# Patient Record
Sex: Female | Born: 1945 | Race: White | Hispanic: No | Marital: Married | Smoking: Never smoker
Health system: Southern US, Community
[De-identification: ages and names within clinical notes are randomized; demographics above are authoritative.]

## PROBLEM LIST (undated history)

## (undated) DIAGNOSIS — E669 Obesity, unspecified: Secondary | ICD-10-CM

## (undated) DIAGNOSIS — J329 Chronic sinusitis, unspecified: Secondary | ICD-10-CM

## (undated) DIAGNOSIS — I4891 Unspecified atrial fibrillation: Secondary | ICD-10-CM

## (undated) DIAGNOSIS — I10 Essential (primary) hypertension: Secondary | ICD-10-CM

## (undated) HISTORY — PX: CATARACT EXTRACTION, BILATERAL: SHX1313

---

## 2015-10-16 ENCOUNTER — Inpatient Hospital Stay (HOSPITAL_COMMUNITY)
Admission: EM | Admit: 2015-10-16 | Discharge: 2015-10-25 | DRG: 492 | Disposition: A | Discharge status: Skilled Nursing Facility | Payer: Medicare (Managed Care) | Attending: Orthopedic Surgery | Admitting: Orthopedic Surgery

## 2015-10-16 ENCOUNTER — Emergency Department (HOSPITAL_COMMUNITY): Payer: Medicare (Managed Care)

## 2015-10-16 DIAGNOSIS — S82851A Displaced trimalleolar fracture of right lower leg, initial encounter for closed fracture: Secondary | ICD-10-CM | POA: Diagnosis present

## 2015-10-16 DIAGNOSIS — T148XXA Other injury of unspecified body region, initial encounter: Secondary | ICD-10-CM

## 2015-10-16 DIAGNOSIS — Z7901 Long term (current) use of anticoagulants: Secondary | ICD-10-CM

## 2015-10-16 DIAGNOSIS — M25571 Pain in right ankle and joints of right foot: Secondary | ICD-10-CM | POA: Diagnosis not present

## 2015-10-16 DIAGNOSIS — J9811 Atelectasis: Secondary | ICD-10-CM | POA: Diagnosis not present

## 2015-10-16 DIAGNOSIS — Z79899 Other long term (current) drug therapy: Secondary | ICD-10-CM

## 2015-10-16 DIAGNOSIS — R0902 Hypoxemia: Secondary | ICD-10-CM

## 2015-10-16 DIAGNOSIS — J9601 Acute respiratory failure with hypoxia: Secondary | ICD-10-CM

## 2015-10-16 DIAGNOSIS — S82891A Other fracture of right lower leg, initial encounter for closed fracture: Secondary | ICD-10-CM

## 2015-10-16 DIAGNOSIS — Z888 Allergy status to other drugs, medicaments and biological substances status: Secondary | ICD-10-CM

## 2015-10-16 DIAGNOSIS — Z6841 Body Mass Index (BMI) 40.0 and over, adult: Secondary | ICD-10-CM

## 2015-10-16 DIAGNOSIS — I10 Essential (primary) hypertension: Secondary | ICD-10-CM

## 2015-10-16 DIAGNOSIS — E119 Type 2 diabetes mellitus without complications: Secondary | ICD-10-CM

## 2015-10-16 DIAGNOSIS — S9304XA Dislocation of right ankle joint, initial encounter: Secondary | ICD-10-CM | POA: Diagnosis present

## 2015-10-16 DIAGNOSIS — Y92000 Kitchen of unspecified non-institutional (private) residence as  the place of occurrence of the external cause: Secondary | ICD-10-CM

## 2015-10-16 DIAGNOSIS — W19XXXA Unspecified fall, initial encounter: Secondary | ICD-10-CM | POA: Diagnosis present

## 2015-10-16 DIAGNOSIS — I4891 Unspecified atrial fibrillation: Secondary | ICD-10-CM

## 2015-10-16 HISTORY — DX: Unspecified atrial fibrillation: I48.91

## 2015-10-16 HISTORY — DX: Chronic sinusitis, unspecified: J32.9

## 2015-10-16 HISTORY — DX: Essential (primary) hypertension: I10

## 2015-10-16 NOTE — ED Notes (Signed)
Bed: [id number] Expected date:  Expected time:  Means of arrival:  Comments: EMS ankle deformity

## 2015-10-16 NOTE — ED Notes (Signed)
EMS reports patient fell off a step going from kitchen to living room. Patient rolled rt ankle, EMS reports obvious deformity and edema. Rt ankle splinted by EMS. Good pedal pulses reported. First BP 150 palpated, HR 76 irregular (hx of A.fib reported). Patient given total of 100 mcg fentanyl and 4mg  iv zofran. Patient bumped rt side of head, no LOC. Denies neck or back pain. EMS reports patient recently started eliquis. Patient is from out of town to care for family members.

## 2015-10-17 ENCOUNTER — Inpatient Hospital Stay (HOSPITAL_COMMUNITY): Payer: Medicare (Managed Care) | Admitting: Critical Care Medicine

## 2015-10-17 ENCOUNTER — Encounter (HOSPITAL_COMMUNITY): Payer: Self-pay | Admitting: Emergency Medicine

## 2015-10-17 ENCOUNTER — Encounter (HOSPITAL_COMMUNITY)
Admission: EM | Disposition: A | Discharge status: Skilled Nursing Facility | Payer: Self-pay | Source: Home / Self Care | Attending: Orthopedic Surgery

## 2015-10-17 ENCOUNTER — Emergency Department (HOSPITAL_COMMUNITY): Payer: Medicare (Managed Care)

## 2015-10-17 DIAGNOSIS — S82851A Displaced trimalleolar fracture of right lower leg, initial encounter for closed fracture: Secondary | ICD-10-CM | POA: Diagnosis present

## 2015-10-17 DIAGNOSIS — Y92000 Kitchen of unspecified non-institutional (private) residence as  the place of occurrence of the external cause: Secondary | ICD-10-CM | POA: Diagnosis not present

## 2015-10-17 DIAGNOSIS — E119 Type 2 diabetes mellitus without complications: Secondary | ICD-10-CM | POA: Diagnosis present

## 2015-10-17 DIAGNOSIS — Z6841 Body Mass Index (BMI) 40.0 and over, adult: Secondary | ICD-10-CM | POA: Diagnosis not present

## 2015-10-17 DIAGNOSIS — W19XXXA Unspecified fall, initial encounter: Secondary | ICD-10-CM | POA: Diagnosis present

## 2015-10-17 DIAGNOSIS — I10 Essential (primary) hypertension: Secondary | ICD-10-CM | POA: Diagnosis present

## 2015-10-17 DIAGNOSIS — J9811 Atelectasis: Secondary | ICD-10-CM | POA: Diagnosis not present

## 2015-10-17 DIAGNOSIS — S9304XA Dislocation of right ankle joint, initial encounter: Secondary | ICD-10-CM | POA: Diagnosis present

## 2015-10-17 DIAGNOSIS — Z888 Allergy status to other drugs, medicaments and biological substances status: Secondary | ICD-10-CM | POA: Diagnosis not present

## 2015-10-17 DIAGNOSIS — I4891 Unspecified atrial fibrillation: Secondary | ICD-10-CM | POA: Diagnosis present

## 2015-10-17 DIAGNOSIS — S82851D Displaced trimalleolar fracture of right lower leg, subsequent encounter for closed fracture with routine healing: Secondary | ICD-10-CM | POA: Diagnosis not present

## 2015-10-17 DIAGNOSIS — Z7901 Long term (current) use of anticoagulants: Secondary | ICD-10-CM | POA: Diagnosis not present

## 2015-10-17 DIAGNOSIS — Z79899 Other long term (current) drug therapy: Secondary | ICD-10-CM | POA: Diagnosis not present

## 2015-10-17 DIAGNOSIS — M25571 Pain in right ankle and joints of right foot: Secondary | ICD-10-CM | POA: Diagnosis present

## 2015-10-17 DIAGNOSIS — I482 Chronic atrial fibrillation: Secondary | ICD-10-CM | POA: Diagnosis not present

## 2015-10-17 DIAGNOSIS — J9601 Acute respiratory failure with hypoxia: Secondary | ICD-10-CM | POA: Diagnosis not present

## 2015-10-17 HISTORY — PX: EXTERNAL FIXATION LEG: SHX1549

## 2015-10-17 HISTORY — PX: ANKLE CLOSED REDUCTION: SHX880

## 2015-10-17 LAB — CBC WITH DIFFERENTIAL/PLATELET
Basophils Absolute: 0 10*3/uL (ref 0.0–0.1)
Basophils Relative: 0 %
EOS PCT: 1 %
Eosinophils Absolute: 0.1 10*3/uL (ref 0.0–0.7)
HCT: 41.5 % (ref 36.0–46.0)
HEMOGLOBIN: 14.2 g/dL (ref 12.0–15.0)
LYMPHS ABS: 1.4 10*3/uL (ref 0.7–4.0)
LYMPHS PCT: 11 %
MCH: 28.7 pg (ref 26.0–34.0)
MCHC: 34.2 g/dL (ref 30.0–36.0)
MCV: 83.8 fL (ref 78.0–100.0)
Monocytes Absolute: 0.7 10*3/uL (ref 0.1–1.0)
Monocytes Relative: 5 %
NEUTROS PCT: 83 %
Neutro Abs: 10.5 10*3/uL — ABNORMAL HIGH (ref 1.7–7.7)
Platelets: 241 10*3/uL (ref 150–400)
RBC: 4.95 MIL/uL (ref 3.87–5.11)
RDW: 14.2 % (ref 11.5–15.5)
WBC: 12.7 10*3/uL — AB (ref 4.0–10.5)

## 2015-10-17 LAB — BASIC METABOLIC PANEL
Anion gap: 11 (ref 5–15)
BUN: 31 mg/dL — AB (ref 6–20)
CHLORIDE: 103 mmol/L (ref 101–111)
CO2: 25 mmol/L (ref 22–32)
Calcium: 8.4 mg/dL — ABNORMAL LOW (ref 8.9–10.3)
Creatinine, Ser: 1.04 mg/dL — ABNORMAL HIGH (ref 0.44–1.00)
GFR calc Af Amer: >60 mL/min (ref >60)
GFR calc non Af Amer: 53 mL/min — ABNORMAL LOW (ref >60)
Glucose, Bld: 200 mg/dL — ABNORMAL HIGH (ref 65–99)
POTASSIUM: 3.7 mmol/L (ref 3.5–5.1)
SODIUM: 139 mmol/L (ref 135–145)

## 2015-10-17 LAB — SURGICAL PCR SCREEN
MRSA, PCR: NEGATIVE
Staphylococcus aureus: NEGATIVE

## 2015-10-17 LAB — GLUCOSE, CAPILLARY
GLUCOSE-CAPILLARY: 171 mg/dL — AB (ref 65–99)
GLUCOSE-CAPILLARY: 181 mg/dL — AB (ref 65–99)

## 2015-10-17 SURGERY — CLOSED REDUCTION, ANKLE
Anesthesia: General | Laterality: Right

## 2015-10-17 MED ORDER — 0.9 % SODIUM CHLORIDE (POUR BTL) OPTIME
TOPICAL | Status: DC | PRN
  Administered 2015-10-17: 1000 mL

## 2015-10-17 MED ORDER — PROPOFOL 10 MG/ML IV BOLUS
INTRAVENOUS | Status: DC | PRN
  Administered 2015-10-17: 50 mg via INTRAVENOUS
  Administered 2015-10-17: 150 mg via INTRAVENOUS

## 2015-10-17 MED ORDER — HYDROMORPHONE HCL 1 MG/ML IJ SOLN
1.0000 mg | INTRAMUSCULAR | Status: DC | PRN
  Administered 2015-10-17 – 2015-10-20 (×13): 1 mg via INTRAVENOUS
  Filled 2015-10-17 (×13): qty 1

## 2015-10-17 MED ORDER — CITALOPRAM HYDROBROMIDE 20 MG PO TABS
20.0000 mg | ORAL_TABLET | Freq: Every day | ORAL | Status: DC
  Administered 2015-10-17 – 2015-10-25 (×9): 20 mg via ORAL
  Filled 2015-10-17 (×9): qty 1

## 2015-10-17 MED ORDER — MORPHINE SULFATE (PF) 2 MG/ML IV SOLN: 2.0000 mg | INTRAVENOUS | Status: DC | PRN

## 2015-10-17 MED ORDER — DOCUSATE SODIUM 100 MG PO CAPS
100.0000 mg | ORAL_CAPSULE | Freq: Two times a day (BID) | ORAL | Status: DC
  Administered 2015-10-17 – 2015-10-22 (×8): 100 mg via ORAL
  Filled 2015-10-17 (×9): qty 1

## 2015-10-17 MED ORDER — LIDOCAINE HCL (CARDIAC) 20 MG/ML IV SOLN
INTRAVENOUS | Status: DC | PRN
  Administered 2015-10-17: 70 mg via INTRAVENOUS

## 2015-10-17 MED ORDER — MIDAZOLAM HCL 2 MG/2ML IJ SOLN
INTRAMUSCULAR | Status: AC
  Filled 2015-10-17: qty 2

## 2015-10-17 MED ORDER — PHENYLEPHRINE 40 MCG/ML (10ML) SYRINGE FOR IV PUSH (FOR BLOOD PRESSURE SUPPORT)
PREFILLED_SYRINGE | INTRAVENOUS | Status: AC
  Filled 2015-10-17: qty 10

## 2015-10-17 MED ORDER — ACETAMINOPHEN 325 MG PO TABS
ORAL_TABLET | ORAL | Status: AC
  Filled 2015-10-17: qty 2

## 2015-10-17 MED ORDER — OXYCODONE HCL 5 MG PO TABS
ORAL_TABLET | ORAL | Status: AC
  Filled 2015-10-17: qty 2

## 2015-10-17 MED ORDER — ONDANSETRON HCL 4 MG PO TABS: 4.0000 mg | ORAL_TABLET | Freq: Four times a day (QID) | ORAL | Status: DC | PRN

## 2015-10-17 MED ORDER — DEXTROSE 5 % IV SOLN
3.0000 g | INTRAVENOUS | Status: DC
  Filled 2015-10-17: qty 3000

## 2015-10-17 MED ORDER — HYDROMORPHONE HCL 1 MG/ML IJ SOLN: 0.5000 mg | Freq: Once | INTRAMUSCULAR | Status: DC

## 2015-10-17 MED ORDER — SUCCINYLCHOLINE CHLORIDE 20 MG/ML IJ SOLN
INTRAMUSCULAR | Status: DC | PRN
  Administered 2015-10-17: 120 mg via INTRAVENOUS

## 2015-10-17 MED ORDER — ONDANSETRON HCL 4 MG/2ML IJ SOLN
INTRAMUSCULAR | Status: AC
  Filled 2015-10-17: qty 2

## 2015-10-17 MED ORDER — ONDANSETRON HCL 4 MG/2ML IJ SOLN
4.0000 mg | Freq: Once | INTRAMUSCULAR | Status: AC
  Administered 2015-10-17: 4 mg via INTRAVENOUS
  Filled 2015-10-17: qty 2

## 2015-10-17 MED ORDER — MIDAZOLAM HCL 5 MG/5ML IJ SOLN
INTRAMUSCULAR | Status: DC | PRN
  Administered 2015-10-17: 1 mg via INTRAVENOUS

## 2015-10-17 MED ORDER — FENTANYL CITRATE (PF) 250 MCG/5ML IJ SOLN
INTRAMUSCULAR | Status: AC
  Filled 2015-10-17: qty 5

## 2015-10-17 MED ORDER — FENTANYL CITRATE (PF) 100 MCG/2ML IJ SOLN
INTRAMUSCULAR | Status: AC
  Administered 2015-10-17: 100 ug
  Filled 2015-10-17: qty 2

## 2015-10-17 MED ORDER — ACETAMINOPHEN 500 MG PO TABS: 1000.0000 mg | ORAL_TABLET | Freq: Four times a day (QID) | ORAL | Status: DC | PRN

## 2015-10-17 MED ORDER — INSULIN ASPART 100 UNIT/ML ~~LOC~~ SOLN
0.0000 [IU] | Freq: Three times a day (TID) | SUBCUTANEOUS | Status: DC
  Administered 2015-10-18 (×2): 3 [IU] via SUBCUTANEOUS

## 2015-10-17 MED ORDER — ONDANSETRON HCL 4 MG/2ML IJ SOLN
INTRAMUSCULAR | Status: DC | PRN
  Administered 2015-10-17: 4 mg via INTRAVENOUS

## 2015-10-17 MED ORDER — METOCLOPRAMIDE HCL 5 MG PO TABS: 5.0000 mg | ORAL_TABLET | Freq: Three times a day (TID) | ORAL | Status: DC | PRN

## 2015-10-17 MED ORDER — METOCLOPRAMIDE HCL 5 MG/ML IJ SOLN: 5.0000 mg | Freq: Three times a day (TID) | INTRAMUSCULAR | Status: DC | PRN

## 2015-10-17 MED ORDER — FENTANYL CITRATE (PF) 100 MCG/2ML IJ SOLN
50.0000 ug | Freq: Once | INTRAMUSCULAR | Status: AC
  Administered 2015-10-17: 50 ug via INTRAVENOUS
  Filled 2015-10-17: qty 2

## 2015-10-17 MED ORDER — LIDOCAINE HCL (CARDIAC) 20 MG/ML IV SOLN
INTRAVENOUS | Status: AC
  Filled 2015-10-17: qty 5

## 2015-10-17 MED ORDER — ATENOLOL 50 MG PO TABS
100.0000 mg | ORAL_TABLET | Freq: Every day | ORAL | Status: DC
  Administered 2015-10-17 – 2015-10-25 (×9): 100 mg via ORAL
  Filled 2015-10-17 (×9): qty 2

## 2015-10-17 MED ORDER — LACTATED RINGERS IV SOLN
INTRAVENOUS | Status: DC
  Administered 2015-10-17: 14:00:00 via INTRAVENOUS

## 2015-10-17 MED ORDER — ACETAMINOPHEN 325 MG PO TABS
650.0000 mg | ORAL_TABLET | Freq: Four times a day (QID) | ORAL | Status: DC | PRN
  Administered 2015-10-17: 650 mg via ORAL

## 2015-10-17 MED ORDER — LABETALOL HCL 5 MG/ML IV SOLN
5.0000 mg | INTRAVENOUS | Status: DC | PRN
  Administered 2015-10-17 (×2): 5 mg via INTRAVENOUS

## 2015-10-17 MED ORDER — DM-GUAIFENESIN ER 30-600 MG PO TB12
1.0000 | ORAL_TABLET | Freq: Two times a day (BID) | ORAL | Status: DC
  Administered 2015-10-17 – 2015-10-25 (×16): 1 via ORAL
  Filled 2015-10-17 (×16): qty 1

## 2015-10-17 MED ORDER — DEXAMETHASONE SODIUM PHOSPHATE 4 MG/ML IJ SOLN
INTRAMUSCULAR | Status: DC | PRN
  Administered 2015-10-17: 4 mg via INTRAVENOUS

## 2015-10-17 MED ORDER — PANTOPRAZOLE SODIUM 40 MG PO TBEC
40.0000 mg | DELAYED_RELEASE_TABLET | Freq: Every day | ORAL | Status: DC
  Administered 2015-10-17 – 2015-10-25 (×9): 40 mg via ORAL
  Filled 2015-10-17 (×9): qty 1

## 2015-10-17 MED ORDER — ALBUTEROL SULFATE HFA 108 (90 BASE) MCG/ACT IN AERS
INHALATION_SPRAY | RESPIRATORY_TRACT | Status: DC | PRN
  Administered 2015-10-17: 2 via RESPIRATORY_TRACT

## 2015-10-17 MED ORDER — OXYCODONE HCL 5 MG PO TABS
5.0000 mg | ORAL_TABLET | ORAL | Status: DC | PRN
  Administered 2015-10-17 (×2): 10 mg via ORAL
  Administered 2015-10-17: 5 mg via ORAL
  Administered 2015-10-18 – 2015-10-22 (×13): 10 mg via ORAL
  Filled 2015-10-17 (×2): qty 2
  Filled 2015-10-17: qty 1
  Filled 2015-10-17 (×13): qty 2

## 2015-10-17 MED ORDER — ACETAMINOPHEN 650 MG RE SUPP: 650.0000 mg | Freq: Four times a day (QID) | RECTAL | Status: DC | PRN

## 2015-10-17 MED ORDER — SUCCINYLCHOLINE CHLORIDE 20 MG/ML IJ SOLN
INTRAMUSCULAR | Status: AC
  Filled 2015-10-17: qty 1

## 2015-10-17 MED ORDER — ESMOLOL HCL 100 MG/10ML IV SOLN
INTRAVENOUS | Status: DC | PRN
  Administered 2015-10-17: 40 mg via INTRAVENOUS
  Administered 2015-10-17: 30 mg via INTRAVENOUS

## 2015-10-17 MED ORDER — HYDROMORPHONE HCL 1 MG/ML IJ SOLN
INTRAMUSCULAR | Status: AC
  Filled 2015-10-17: qty 1

## 2015-10-17 MED ORDER — KETOROLAC TROMETHAMINE 30 MG/ML IJ SOLN
30.0000 mg | Freq: Once | INTRAMUSCULAR | Status: AC
  Administered 2015-10-17: 30 mg via INTRAVENOUS
  Filled 2015-10-17: qty 1

## 2015-10-17 MED ORDER — LIDOCAINE-EPINEPHRINE 2 %-1:100000 IJ SOLN
30.0000 mL | Freq: Once | INTRAMUSCULAR | Status: DC
  Filled 2015-10-17: qty 40

## 2015-10-17 MED ORDER — NALOXONE HCL 0.4 MG/ML IJ SOLN
INTRAMUSCULAR | Status: DC | PRN
Start: 2015-10-17 — End: 2015-10-17
  Administered 2015-10-17: 0.1 mg via INTRAVENOUS

## 2015-10-17 MED ORDER — FENTANYL CITRATE (PF) 100 MCG/2ML IJ SOLN
INTRAMUSCULAR | Status: DC | PRN
  Administered 2015-10-17: 50 ug via INTRAVENOUS
  Administered 2015-10-17: 100 ug via INTRAVENOUS

## 2015-10-17 MED ORDER — TRIAMTERENE-HCTZ 75-50 MG PO TABS
1.0000 | ORAL_TABLET | Freq: Every day | ORAL | Status: DC
  Administered 2015-10-17 – 2015-10-25 (×9): 1 via ORAL
  Filled 2015-10-17 (×9): qty 1

## 2015-10-17 MED ORDER — METHOCARBAMOL 1000 MG/10ML IJ SOLN
500.0000 mg | Freq: Once | INTRAMUSCULAR | Status: AC
  Administered 2015-10-17: 500 mg via INTRAMUSCULAR
  Filled 2015-10-17: qty 5

## 2015-10-17 MED ORDER — SODIUM CHLORIDE 0.9 % IV SOLN
INTRAVENOUS | Status: DC
  Administered 2015-10-17 (×2): via INTRAVENOUS

## 2015-10-17 MED ORDER — AMLODIPINE BESYLATE 2.5 MG PO TABS
2.5000 mg | ORAL_TABLET | Freq: Every day | ORAL | Status: DC
  Administered 2015-10-17 – 2015-10-25 (×9): 2.5 mg via ORAL
  Filled 2015-10-17 (×9): qty 1

## 2015-10-17 MED ORDER — NALOXONE HCL 0.4 MG/ML IJ SOLN
INTRAMUSCULAR | Status: AC
  Filled 2015-10-17: qty 1

## 2015-10-17 MED ORDER — ONDANSETRON HCL 4 MG/2ML IJ SOLN: 4.0000 mg | Freq: Four times a day (QID) | INTRAMUSCULAR | Status: DC | PRN

## 2015-10-17 MED ORDER — LACTATED RINGERS IV SOLN
INTRAVENOUS | Status: DC | PRN
  Administered 2015-10-17: 15:00:00 via INTRAVENOUS

## 2015-10-17 MED ORDER — LABETALOL HCL 5 MG/ML IV SOLN
INTRAVENOUS | Status: AC
  Filled 2015-10-17: qty 4

## 2015-10-17 MED ORDER — LIDOCAINE HCL (PF) 1 % IJ SOLN
30.0000 mL | Freq: Once | INTRAMUSCULAR | Status: AC
  Administered 2015-10-17: 30 mL
  Filled 2015-10-17: qty 30

## 2015-10-17 MED ORDER — SENNA 8.6 MG PO TABS
1.0000 | ORAL_TABLET | Freq: Two times a day (BID) | ORAL | Status: DC
  Administered 2015-10-17 – 2015-10-22 (×8): 8.6 mg via ORAL
  Filled 2015-10-17 (×9): qty 1

## 2015-10-17 MED ORDER — LACTATED RINGERS IV SOLN
INTRAVENOUS | Status: DC
  Administered 2015-10-17: 05:00:00 via INTRAVENOUS

## 2015-10-17 MED ORDER — FENTANYL CITRATE (PF) 100 MCG/2ML IJ SOLN: 100.0000 ug | Freq: Once | INTRAMUSCULAR | Status: DC

## 2015-10-17 MED ORDER — ROCURONIUM BROMIDE 50 MG/5ML IV SOLN
INTRAVENOUS | Status: AC
  Filled 2015-10-17: qty 1

## 2015-10-17 SURGICAL SUPPLY — 51 items
BANDAGE ACE 4X5 VEL STRL LF (GAUZE/BANDAGES/DRESSINGS) ×3 IMPLANT
BAR GLASS FIBER EXFX 11X350 (EXFIX) ×6 IMPLANT
BLADE SURG 10 STRL SS (BLADE) ×3 IMPLANT
BNDG COHESIVE 4X5 TAN STRL (GAUZE/BANDAGES/DRESSINGS) IMPLANT
BNDG COHESIVE 6X5 TAN STRL LF (GAUZE/BANDAGES/DRESSINGS) IMPLANT
CANISTER SUCT 3000ML PPV (MISCELLANEOUS) ×3 IMPLANT
CHLORAPREP W/TINT 26ML (MISCELLANEOUS) ×3 IMPLANT
CLAMP BLUE BAR TO PIN (MISCELLANEOUS) ×6 IMPLANT
COVER SURGICAL LIGHT HANDLE (MISCELLANEOUS) ×3 IMPLANT
CUFF TOURNIQUET SINGLE 34IN LL (TOURNIQUET CUFF) IMPLANT
CUFF TOURNIQUET SINGLE 44IN (TOURNIQUET CUFF) IMPLANT
DRAPE C-ARM 42X72 X-RAY (DRAPES) IMPLANT
DRAPE U-SHAPE 47X51 STRL (DRAPES) ×3 IMPLANT
DRSG ADAPTIC 3X8 NADH LF (GAUZE/BANDAGES/DRESSINGS) IMPLANT
DRSG PAD ABDOMINAL 8X10 ST (GAUZE/BANDAGES/DRESSINGS) IMPLANT
ELECT REM PT RETURN 9FT ADLT (ELECTROSURGICAL) ×3
ELECTRODE REM PT RTRN 9FT ADLT (ELECTROSURGICAL) ×1 IMPLANT
GAUZE SPONGE 4X4 12PLY STRL (GAUZE/BANDAGES/DRESSINGS) IMPLANT
GLOVE BIO SURGEON STRL SZ8 (GLOVE) ×3 IMPLANT
GLOVE BIOGEL PI IND STRL 8 (GLOVE) ×2 IMPLANT
GLOVE BIOGEL PI INDICATOR 8 (GLOVE) ×4
GLOVE ECLIPSE 7.5 STRL STRAW (GLOVE) ×3 IMPLANT
GOWN STRL REUS W/ TWL LRG LVL3 (GOWN DISPOSABLE) ×1 IMPLANT
GOWN STRL REUS W/ TWL XL LVL3 (GOWN DISPOSABLE) ×2 IMPLANT
GOWN STRL REUS W/TWL LRG LVL3 (GOWN DISPOSABLE) ×2
GOWN STRL REUS W/TWL XL LVL3 (GOWN DISPOSABLE) ×4
KIT BASIN OR (CUSTOM PROCEDURE TRAY) ×3 IMPLANT
KIT ROOM TURNOVER OR (KITS) ×3 IMPLANT
NEEDLE 22X1 1/2 (OR ONLY) (NEEDLE) IMPLANT
NS IRRIG 1000ML POUR BTL (IV SOLUTION) ×3 IMPLANT
PACK ORTHO EXTREMITY (CUSTOM PROCEDURE TRAY) ×3 IMPLANT
PAD ARMBOARD 7.5X6 YLW CONV (MISCELLANEOUS) ×6 IMPLANT
PAD CAST 4YDX4 CTTN HI CHSV (CAST SUPPLIES) IMPLANT
PADDING CAST COTTON 4X4 STRL (CAST SUPPLIES)
PIN CLAMP 2BAR 75MM BLUE (PIN) ×3 IMPLANT
PIN HALF YELLOW 5X160X35 (PIN) ×6 IMPLANT
PIN TRANSFIXING 5.0 (PIN) ×3 IMPLANT
SOAP 2 % CHG 4 OZ (WOUND CARE) IMPLANT
SPONGE GAUZE 4X4 12PLY STER LF (GAUZE/BANDAGES/DRESSINGS) ×3 IMPLANT
STAPLER VISISTAT 35W (STAPLE) IMPLANT
SUCTION FRAZIER HANDLE 10FR (MISCELLANEOUS)
SUCTION TUBE FRAZIER 10FR DISP (MISCELLANEOUS) IMPLANT
SUT PROLENE 3 0 PS 2 (SUTURE) ×3 IMPLANT
SUT VIC AB 3-0 PS2 18 (SUTURE) ×2
SUT VIC AB 3-0 PS2 18XBRD (SUTURE) ×1 IMPLANT
SYR CONTROL 10ML LL (SYRINGE) IMPLANT
TOWEL OR 17X24 6PK STRL BLUE (TOWEL DISPOSABLE) ×3 IMPLANT
TOWEL OR 17X26 10 PK STRL BLUE (TOWEL DISPOSABLE) ×3 IMPLANT
TUBE CONNECTING 12'X1/4 (SUCTIONS)
TUBE CONNECTING 12X1/4 (SUCTIONS) IMPLANT
WATER STERILE IRR 1000ML POUR (IV SOLUTION) IMPLANT

## 2015-10-17 NOTE — ED Notes (Signed)
[facility] called for transportation 

## 2015-10-17 NOTE — Transfer of Care (Signed)
Immediate Anesthesia Transfer of Care Note  Patient: Marisa Tran  Procedure(s) Performed: Procedure(s): CLOSED REDUCTION ANKLE (Right) EXTERNAL FIXATION RIGHT ANKLE (Right)  Patient Location: PACU  Anesthesia Type:General  Level of Consciousness: awake, alert  and oriented  Airway & Oxygen Therapy: Patient Spontanous Breathing and Patient connected to face mask oxygen  Post-op Assessment: Report given to RN, Post -op Vital signs reviewed and stable and Patient moving all extremities X 4  Post vital signs: Reviewed and stable  Last Vitals:  Filed Vitals:   10/17/15 0500 10/17/15 1010  BP: 119/66 121/59  Pulse: 88 78  Temp: 36.9 C 36.7 C  Resp: 19 16    Complications: No apparent anesthesia complications

## 2015-10-17 NOTE — ED Notes (Signed)
Patient transported to X-ray 

## 2015-10-17 NOTE — Anesthesia Preprocedure Evaluation (Addendum)
Anesthesia Evaluation  Patient identified by MRN, date of birth, ID band Patient awake  General Assessment Comment:Seems confused today  Reviewed: Allergy & Precautions, NPO status , Patient's Chart, lab work & pertinent test results  Airway Mallampati: II  TM Distance: >3 FB Neck ROM: Full    Dental  (+) Teeth Intact, Dental Advisory Given   Pulmonary neg pulmonary ROS,    breath sounds clear to auscultation       Cardiovascular hypertension,  Rhythm:Irregular Rate:Normal     Neuro/Psych negative neurological ROS     GI/Hepatic negative GI ROS, Neg liver ROS,   Endo/Other  Morbid obesity  Renal/GU negative Renal ROS     Musculoskeletal  (+) Arthritis ,   Abdominal (+) + obese,   Peds  Hematology   Anesthesia Other Findings Recent Eliquuis  Reproductive/Obstetrics                           Anesthesia Physical Anesthesia Plan  ASA: III  Anesthesia Plan: General   Post-op Pain Management:    Induction: Intravenous  Airway Management Planned: Oral ETT  Additional Equipment:   Intra-op Plan:   Post-operative Plan: Extubation in OR  Informed Consent: I have reviewed the patients History and Physical, chart, labs and discussed the procedure including the risks, benefits and alternatives for the proposed anesthesia with the patient or authorized representative who has indicated his/her understanding and acceptance.   Dental advisory given  Plan Discussed with: CRNA and Surgeon  Anesthesia Plan Comments:         Anesthesia Quick Evaluation

## 2015-10-17 NOTE — Brief Op Note (Signed)
10/16/2015 - 10/17/2015  3:32 PM  PATIENT:  Beckey Fomby  70 y.o. female  PRE-OPERATIVE DIAGNOSIS: 1.  Right ankle fracture dislocation  POST-OPERATIVE DIAGNOSIS: 1.  Right ankle trimalleolar fracture      2.  Right ankle dislocation  Procedure(s): 1.  Closed treatment of right ankle dislocation under anesthesia   2.  Application of multiplane external fixator across the right ankle   3.  AP and lateral xrays of the right ankle  SURGEON:  Toni ArthursJohn Dyonna Jaspers, MD  ASSISTANT: n/a  ANESTHESIA:   General  EBL:  minimal   TOURNIQUET:  n/a  COMPLICATIONS:  None apparent  DISPOSITION:  Extubated, awake and stable to recovery.  DICTATION ID:  [id number]

## 2015-10-17 NOTE — ED Notes (Signed)
Ortho tech is here, PA aware

## 2015-10-17 NOTE — ED Notes (Signed)
Patient states pain improved with pain med but is having painful muscle spasms every 10-30 seconds. Provider notified.

## 2015-10-17 NOTE — Progress Notes (Signed)
Orthopedic Tech Progress Note Patient Details:  Marisa JohnsSheryl Tran 05/07/1946 [id number]  Ortho Devices Type of Ortho Device: Post (short leg) splint, Stirrup splint Ortho Device/Splint Location: rle Ortho Device/Splint Interventions: Ordered, Application   [name], [name] 10/17/2015, 2:09 AM

## 2015-10-17 NOTE — ED Notes (Signed)
Report to [facility], on way

## 2015-10-17 NOTE — Op Note (Signed)
NAMMalvin Johns:  Nordhoff, Bryelle               ACCOUNT NO.:  [id number]  MEDICAL RECORD NO.:  [id number]  LOCATION:  [id number]                        FACILITY:  [facility]  PHYSICIAN:  Toni ArthursJohn Makaylyn Sinyard, MD        DATE OF BIRTH:  [birth year]  DATE OF PROCEDURE: DATE OF DISCHARGE:                              OPERATIVE REPORT   PREOPERATIVE DIAGNOSIS:  Right ankle fracture dislocation.  POSTOPERATIVE DIAGNOSES: 1. Right ankle trimalleolar fracture. 2. Right ankle dislocation.  PROCEDURE: 1. Closed treatment of right ankle dislocation under anesthesia. 2. Application of multiplane external fixator across the right ankle. 3. AP and lateral radiographs of the right ankle.  SURGEON:  Toni ArthursJohn Birt Reinoso, MD.  ASSISTANT:  None.  ANESTHESIA:  General.  ESTIMATED BLOOD LOSS:  Minimal.  TOURNIQUET TIME:  Zero.  COMPLICATIONS:  None apparent.  DISPOSITION:  Extubated, awake, and stable to recovery.  INDICATION FOR PROCEDURE:  The patient is a 70 year old woman with past medical history significant for atrial fibrillation and morbid obesity. She was in town taking care of her grandchildren, when she slipped on a step at her daughter's home and fell.  She came to the emergency room and radiographs revealed a fracture dislocation of the right ankle.  An attempt was made in the emergency department to perform closed reduction using a hematoma block.  This was unsuccessful.  The patient now presents for closed reduction and likely application of an external fixator depending on the stability of the fracture.  The patient is not a candidate for open treatment of this ankle injury at this time due to her history of taking Eliquis for atrial fibrillation.  She understands the risks and benefits, the alternative treatment options, and elects surgical treatment.  She specifically understands risks of bleeding, infection, nerve damage, blood clots, need for additional surgery, continued pain, nonunion, amputation, and  death.  PROCEDURE IN DETAIL:  After preoperative consent was obtained and the correct operative site was identified, the patient was brought to the operating room and placed supine on the operating table.  General anesthesia was induced.  Preoperative antibiotics were administered. Surgical time-out was taken.  The right lower extremity was prepped and draped in standard sterile fashion.  A longitudinal traction was applied and the ankle was felt to be reduced.  However, it dislocated easily with any movement at all.  This degree of instability requires application of an external fixator to maintain reduction of the ankle joint.  Two stab incisions were made at the tibia and a large pin clamp was used to insert 2 Schanz pins in the tibial shaft.  These were inserted after pre-drilling and both noted to have excellent purchase.  The clamp was then tightened on the pins.  A stab incision was made at the lateral wall of the calcaneus and a transfixion pin was inserted in percutaneous fashion and advanced out through the medial skin taking care to protect the neurovascular structures.  Two pin-to-bar clamps were applied. Carbon fiber external fixator bars were then applied proximally and distally on either side of the ankle.  Longitudinal traction was then applied, and the ankle joint was reduced to a neutral position.  The clamps were all  tightened provisionally.  AP and lateral radiographs confirmed appropriate reduction of the tibiotalar joint and appropriate alignment of the fractures.  The pin holes were all dressed with Xeroform and Kerlix.  A posterior splint was then applied and wrapped with Ace wrap.  The transfixion pin was capped medially and laterally. The patient was then awakened from anesthesia and transported to the recovery room in stable condition.  FOLLOWUP PLAN:  The patient will be nonweightbearing on the right lower extremity.  She will be readmitted to [facility] [place].  She  will start physical therapy tomorrow to assess whether she can go home or will need a skilled nursing facility.  She will need operative treatment of this unstable ankle fracture once her Eliquis is cleared from her system and her swelling resolved sufficiently.  I think this likely will take several days before she will be a candidate for definitive fixation of her ankle fracture.  RADIOGRAPHS:  AP and lateral radiographs are obtained of the right ankle intraoperatively.  These show interval reduction of the ankle joint as well as the trimalleolar ankle fracture.  No other acute injuries are noted.  Hardware is appropriately positioned.     Toni Arthurs, MD     JH/MEDQ  D:  10/17/2015  T:  10/17/2015  Job:  [id number]

## 2015-10-17 NOTE — ED Provider Notes (Signed)
CSN: [id number]     Arrival date & time 10/16/15  2310 History   First MD Initiated Contact with Patient 10/17/15 0022     No chief complaint on file.    (Consider location/radiation/quality/duration/timing/severity/associated sxs/prior Treatment) HPI  PMH hypertension, a. Fib and chronic sinus infections.  Patient to the ER by EMS with complaints of right ankle injury. She fell taking a misstep in the kitchen. [place] here from [place] to take care of of her grand-kids. She has obvious deformity with swelling and edema. She bumped the right side of her head and had no loc. She has gross deformity of the right ankle. Pain is 8/10. Pt has PMH of a.fib and is on Eliquis. EMS gave 100 mcg Fentanyl and 4 mg IV Zofran    Past Medical History  Diagnosis Date  . Hypertension   . Atrial fibrillation (HCC)   . Chronic sinus infection    Past Surgical History  Procedure Laterality Date  . Cataract extraction, bilateral     History reviewed. No pertinent family history. Social History  Substance Use Topics  . Smoking status: Never Smoker   . Smokeless tobacco: None  . Alcohol Use: Yes     Comment: maybe once every 3 months   OB History    No data available     Review of Systems  Review of Systems All other systems negative except as documented in the HPI. All pertinent positives and negatives as reviewed in the HPI.   Allergies  Iodine  Home Medications   Prior to Admission medications   Medication Sig Start Date End Date Taking? Authorizing Provider  acetaminophen (TYLENOL) 500 MG tablet Take 1,000 mg by mouth every 6 (six) hours as needed for mild pain, moderate pain or headache.   Yes Historical Provider, MD  amLODipine (NORVASC) 2.5 MG tablet Take 2.5 mg by mouth daily.   Yes Historical Provider, MD  apixaban (ELIQUIS) 5 MG TABS tablet Take 5 mg by mouth 2 (two) times daily.   Yes Historical Provider, MD  atenolol (TENORMIN) 100 MG tablet Take 100 mg by mouth daily.   Yes  Historical Provider, MD  citalopram (CELEXA) 20 MG tablet Take 20 mg by mouth daily.   Yes Historical Provider, MD  dextromethorphan-guaiFENesin (MUCINEX DM) 30-600 MG 12hr tablet Take 1 tablet by mouth 2 (two) times daily.   Yes Historical Provider, MD  ibuprofen (ADVIL,MOTRIN) 200 MG tablet Take 400 mg by mouth every 6 (six) hours as needed for headache, mild pain or moderate pain.   Yes Historical Provider, MD  loperamide (IMODIUM A-D) 2 MG tablet Take 2 mg by mouth 4 (four) times daily as needed for diarrhea or loose stools.   Yes Historical Provider, MD  omeprazole (PRILOSEC) 20 MG capsule Take 20 mg by mouth daily.   Yes Historical Provider, MD  triamterene-hydrochlorothiazide (MAXZIDE) 75-50 MG tablet Take 1 tablet by mouth daily.   Yes Historical Provider, MD   BP 110/64 mmHg  Pulse 44  Temp(Src) 97.5 F (36.4 C) (Oral)  Resp 19  Ht 5\' 5"  (1.651 m)  Wt 137.893 kg  BMI 50.59 kg/m2  SpO2 96% Physical Exam  Constitutional: She appears well-developed and well-nourished. No distress.  HENT:  Head: Normocephalic and atraumatic.  Right Ear: Tympanic membrane and ear canal normal.  Left Ear: Tympanic membrane and ear canal normal.  Nose: Nose normal.  Mouth/Throat: Uvula is midline, oropharynx is clear and moist and mucous membranes are normal.  Eyes: Pupils are  equal, round, and reactive to light.  Neck: Normal range of motion. Neck supple.  Cardiovascular: Normal rate and regular rhythm.   Pulmonary/Chest: Effort normal.  Abdominal: Soft.  No signs of abdominal distention  Musculoskeletal:       Right ankle: She exhibits decreased range of motion, swelling, ecchymosis and deformity. She exhibits no laceration and normal pulse. Tenderness. Lateral malleolus and medial malleolus tenderness found. Achilles tendon exhibits pain.  Patient has significant deformity of the ankle joint. Sensations intact Pedal pulses are palpable. No laceration or skin tenting  Neurological: She is  alert.  Acting at baseline  Skin: Skin is warm and dry. No rash noted.  Nursing note and vitals reviewed.   ED Course  Reduction of fracture Date/Time: 10/17/2015 3:32 AM Performed by: Marlon Pel Authorized by: Marlon Pel Consent: Verbal consent obtained. Risks and benefits: risks, benefits and alternatives were discussed Consent given by: patient Patient understanding: patient states understanding of the procedure being performed Patient identity confirmed: verbally with patient and arm band Time out: Immediately prior to procedure a "time out" was called to verify the correct patient, procedure, equipment, support staff and site/side marked as required. Local anesthesia used: no Patient tolerance: Patient tolerated the procedure well with no immediate complications Comments: I assisted Dr. Nicanor Alcon with the reduction of this ankle. Posterior splint with stirrup applied by ortho tech.    (including critical care time) Labs Review Labs Reviewed  BASIC METABOLIC PANEL - Abnormal; Notable for the following:    Glucose, Bld 200 (*)    BUN 31 (*)    Creatinine, Ser 1.04 (*)    Calcium 8.4 (*)    GFR calc non Af Amer 53 (*)    All other components within normal limits  CBC WITH DIFFERENTIAL/PLATELET - Abnormal; Notable for the following:    WBC 12.7 (*)    Neutro Abs 10.5 (*)    All other components within normal limits    Imaging Review Dg Tibia/fibula Right  10/17/2015  CLINICAL DATA:  Twisting injury to the right ankle after a fall. Pain and deformity. EXAM: RIGHT TIBIA AND FIBULA - 2 VIEW COMPARISON:  None. FINDINGS: The distal right tibia and fibula are not included within the field of view and are evaluated on separate imaging of the right ankle. See additional report. The visualized proximal and midportion of the right tibia and fibula appear intact without additional fractures identified. Right knee joint is not displaced with mild degenerative changes present.  Visualized soft tissues are unremarkable. IMPRESSION: No additional fractures demonstrated to the proximal/mid right tibia and fibula. Electronically Signed   By: Burman Nieves M.D.   On: 10/17/2015 00:02   Dg Ankle Complete Right  10/16/2015  CLINICAL DATA:  Trip and fall injury on steps, twisting the right ankle. Deformity and pain. EXAM: RIGHT ANKLE - COMPLETE 3+ VIEW COMPARISON:  None. FINDINGS: Severe comminuted and displaced fractures of the right ankle. Fractures of the medial malleolus of the right ankle with lateral displacement of the distal fracture fragment. Fracture of the anterior malleolus of the distal tibia with a small avulsion fracture off of the posterior malleolus of the distal tibia. There is complete dislocation of the talus with respect to the tibia posteriorly and near complete dislocation of the talus with respect to the tibia laterally. Fracture of the distal right fibula with lateral displacement, overriding, and angulation of the distal fracture fragment. Posterior dislocation of the distal fracture fragment. The talar dome appears intact. Small ossicle  over the dorsal aspect of the distal talus likely represents an avulsion fracture at the insertion of the anterior talofibular ligament. IMPRESSION: Severe comminuted and displaced fractures of the right ankle as described above. Electronically Signed   By: Burman Nieves M.D.   On: 10/16/2015 23:59   Dg Foot 2 Views Right  10/17/2015  CLINICAL DATA:  Twisting injury to the right ankle after a fall. Pain and deformity. EXAM: RIGHT FOOT - 2 VIEW COMPARISON:  None. FINDINGS: Severe comminuted and displaced fractures of the right ankle. See additional report of right ankle from today's date. Right foot appears otherwise intact without additional fractures demonstrated. Due to patient positioning, the Lisfranc joint is not well evaluated and injury here is not excluded radiographically. IMPRESSION: Severely comminuted and displaced  fractures of the right ankle. See additional report. No additional fractures demonstrated on limited imaging of right foot. Electronically Signed   By: Burman Nieves M.D.   On: 10/17/2015 00:01   I have personally reviewed and evaluated these images and lab results as part of my medical decision-making.   EKG Interpretation None      MDM   Final diagnoses:  Fracture dislocation of ankle, right, closed, initial encounter   Post splint with side stirrup. NPO and stop Eliquis   Dr. Victorino Dike to admit patient after hospitalist Dr. Julian Reil has refused to admit and transfer patient. Dr. Victorino Dike has requested she be NPO, Ortho Admission, Medsurg. Transfer [facility]. Joint reduced the best as possible with posterior splint with side stirrup. @ 3:45 am the patients pain level is 3/10, she understands that she is to be transferred to [facility] to repair ankle- reviewed xrays with her.  Filed Vitals:   10/17/15 0254 10/17/15 0300  BP: 120/58 110/64  Pulse: 77 44  Temp:    Resp: 24 [address] PA-C 10/17/15 0454  April Palumbo, MD 10/17/15 [phone]

## 2015-10-17 NOTE — ED Notes (Signed)
[phone], Fermin SchwabAngie Hughes the other Grandmother

## 2015-10-17 NOTE — Anesthesia Postprocedure Evaluation (Signed)
Anesthesia Post Note  Patient: Marisa Tran  Procedure(s) Performed: Procedure(s) (LRB): CLOSED REDUCTION ANKLE (Right) EXTERNAL FIXATION RIGHT ANKLE (Right)  Patient location during evaluation: PACU Anesthesia Type: General Level of consciousness: awake and alert Pain management: pain level controlled Vital Signs Assessment: post-procedure vital signs reviewed and stable Respiratory status: spontaneous breathing, nonlabored ventilation, respiratory function stable and patient connected to nasal cannula oxygen Cardiovascular status: blood pressure returned to baseline and stable Postop Assessment: no signs of nausea or vomiting Anesthetic complications: no    Last Vitals:  Filed Vitals:   10/17/15 1609 10/17/15 1620  BP: 193/73 166/86  Pulse: 109 117  Temp: 36.3 C   Resp: 16 21    Last Pain:  Filed Vitals:   10/17/15 1623  PainSc: Asleep                 Sebastian Acheheodore Maurilio Puryear

## 2015-10-17 NOTE — Anesthesia Procedure Notes (Signed)
Procedure Name: Intubation Date/Time: 10/17/2015 2:47 PM Performed by: Glo HerringLEE, Jackelyne Sayer B Pre-anesthesia Checklist: Patient identified, Emergency Drugs available, Suction available, Patient being monitored and Timeout performed Patient Re-evaluated:Patient Re-evaluated prior to inductionOxygen Delivery Method: Circle system utilized Preoxygenation: Pre-oxygenation with 100% oxygen Intubation Type: IV induction and Cricoid Pressure applied Ventilation: Mask ventilation without difficulty Laryngoscope Size: Mac and 3 Grade View: Grade III Tube type: Oral Tube size: 7.5 mm Number of attempts: 2 Airway Equipment and Method: Stylet Placement Confirmation: breath sounds checked- equal and bilateral,  CO2 detector,  positive ETCO2 and ETT inserted through vocal cords under direct vision Secured at: 21 cm Tube secured with: Tape Dental Injury: Teeth and Oropharynx as per pre-operative assessment

## 2015-10-17 NOTE — H&P (Signed)
Marisa Tran is an 70 y.o. female.   Chief Complaint: right ankle pain HPI: 70 y/o female with PMH of morbid obesity and afib (on Eliquus) fell yesterday evening while babysitting her grandchildren.  She resides in [place] but came down to stay with her grandchildren while her daughter and son in law went to [place] for their 25th wedding anniversary.  She c/o pain in the right ankle.  She was unable to bear weight on the right lower extremity.  She presented to the ER where radiographs show a right ankle trimal fracture dislocation.  She underwent unsuccessful attempt at reduction by the ED MD.  She is now splinted.  She c/o moderate aching pain in the ankle that is worse when sshe tries to move and better with rest and elevation.  SHe has a h/o borderline diabetes but is not treated with meds.  Past Medical History  Diagnosis Date  . Hypertension   . Atrial fibrillation ([place])   . Chronic sinus infection     Past Surgical History  Procedure Laterality Date  . Cataract extraction, bilateral      History reviewed. No pertinent family history. Social History:  reports that she has never smoked. She does not have any smokeless tobacco history on file. She reports that she drinks alcohol. Her drug history is not on file.  Allergies:  Allergies  Allergen Reactions  . Iodine Anaphylaxis     (Not in a hospital admission)  Results for orders placed or performed during the hospital encounter of 10/16/15 (from the past 48 hour(s))  Basic metabolic panel     Status: Abnormal   Collection Time: 10/17/15  2:27 AM  Result Value Ref Range   Sodium 139 135 - 145 mmol/L   Potassium 3.7 3.5 - 5.1 mmol/L   Chloride 103 101 - 111 mmol/L   CO2 25 22 - 32 mmol/L   Glucose, Bld 200 (H) 65 - 99 mg/dL   BUN 31 (H) 6 - 20 mg/dL   Creatinine, Ser 1.04 (H) 0.44 - 1.00 mg/dL   Calcium 8.4 (L) 8.9 - 10.3 mg/dL   GFR calc non Af Amer 53 (L) >60 mL/min   GFR calc Af Amer >60 >60 mL/min    Comment: (NOTE) The  eGFR has been calculated using the CKD EPI equation. This calculation has not been validated in all clinical situations. eGFR's persistently <60 mL/min signify possible Chronic Kidney Disease.    Anion gap 11 5 - 15  CBC with Differential/Platelet     Status: Abnormal   Collection Time: 10/17/15  2:27 AM  Result Value Ref Range   WBC 12.7 (H) 4.0 - 10.5 K/uL   RBC 4.95 3.87 - 5.11 MIL/uL   Hemoglobin 14.2 12.0 - 15.0 g/dL   HCT 41.5 36.0 - 46.0 %   MCV 83.8 78.0 - 100.0 fL   MCH 28.7 26.0 - 34.0 pg   MCHC 34.2 30.0 - 36.0 g/dL   RDW 14.2 11.5 - 15.5 %   Platelets 241 150 - 400 K/uL   Neutrophils Relative % 83 %   Neutro Abs 10.5 (H) 1.7 - 7.7 K/uL   Lymphocytes Relative 11 %   Lymphs Abs 1.4 0.7 - 4.0 K/uL   Monocytes Relative 5 %   Monocytes Absolute 0.7 0.1 - 1.0 K/uL   Eosinophils Relative 1 %   Eosinophils Absolute 0.1 0.0 - 0.7 K/uL   Basophils Relative 0 %   Basophils Absolute 0.0 0.0 - 0.1 K/uL  Dg Tibia/fibula Right  10/17/2015  CLINICAL DATA:  Twisting injury to the right ankle after a fall. Pain and deformity. EXAM: RIGHT TIBIA AND FIBULA - 2 VIEW COMPARISON:  None. FINDINGS: The distal right tibia and fibula are not included within the field of view and are evaluated on separate imaging of the right ankle. See additional report. The visualized proximal and midportion of the right tibia and fibula appear intact without additional fractures identified. Right knee joint is not displaced with mild degenerative changes present. Visualized soft tissues are unremarkable. IMPRESSION: No additional fractures demonstrated to the proximal/mid right tibia and fibula. Electronically Signed   By: Lucienne Capers M.D.   On: 10/17/2015 00:02   Dg Ankle 2 Views Right  10/17/2015  CLINICAL DATA:  Ankle fracture postreduction. EXAM: RIGHT ANKLE - 2 VIEW COMPARISON:  Pre reduction exam earlier this day. FINDINGS: Fracture dislocation of the ankle. There is improved alignment, however the  talus remains posterior dislocated with respect to the tibia. There is persistent apex medial angulation. The medial malleolar fragment likely remains aligned with the talus. Displaced fibular fracture in improved alignment with persistent angulation. Overlying splint material in place. IMPRESSION: Some improvement in alignment of the ankle fracture dislocation postreduction, however persistent dislocation of the tibial talar joint. Electronically Signed   By: Jeb Levering M.D.   On: 10/17/2015 02:39   Dg Ankle Complete Right  10/16/2015  CLINICAL DATA:  Trip and fall injury on steps, twisting the right ankle. Deformity and pain. EXAM: RIGHT ANKLE - COMPLETE 3+ VIEW COMPARISON:  None. FINDINGS: Severe comminuted and displaced fractures of the right ankle. Fractures of the medial malleolus of the right ankle with lateral displacement of the distal fracture fragment. Fracture of the anterior malleolus of the distal tibia with a small avulsion fracture off of the posterior malleolus of the distal tibia. There is complete dislocation of the talus with respect to the tibia posteriorly and near complete dislocation of the talus with respect to the tibia laterally. Fracture of the distal right fibula with lateral displacement, overriding, and angulation of the distal fracture fragment. Posterior dislocation of the distal fracture fragment. The talar dome appears intact. Small ossicle over the dorsal aspect of the distal talus likely represents an avulsion fracture at the insertion of the anterior talofibular ligament. IMPRESSION: Severe comminuted and displaced fractures of the right ankle as described above. Electronically Signed   By: Lucienne Capers M.D.   On: 10/16/2015 23:59   Dg Foot 2 Views Right  10/17/2015  CLINICAL DATA:  Twisting injury to the right ankle after a fall. Pain and deformity. EXAM: RIGHT FOOT - 2 VIEW COMPARISON:  None. FINDINGS: Severe comminuted and displaced fractures of the right  ankle. See additional report of right ankle from today's date. Right foot appears otherwise intact without additional fractures demonstrated. Due to patient positioning, the Lisfranc joint is not well evaluated and injury here is not excluded radiographically. IMPRESSION: Severely comminuted and displaced fractures of the right ankle. See additional report. No additional fractures demonstrated on limited imaging of right foot. Electronically Signed   By: Lucienne Capers M.D.   On: 10/17/2015 00:01    ROS  No recent f/c/n/v/wt loss  Blood pressure 110/64, pulse 44, temperature 97.5 F (36.4 C), temperature source Oral, resp. rate 19, height 5' 5"  (1.651 m), weight 137.893 kg (304 lb), SpO2 96 %. Physical Exam  Morbidly obese pleasant female in nad.  A and O x 4.  Mood and affect  normal.  EOMi.  resp unlabored.  R ankle splinted.  Skin swollen and bruised.  No lymphadenopathy.  Sens to LT intact at the forefoot.  5/5 strength in PF adn DF of the toes.  Brisk cap refill at the toes. Assessment/Plan Right ankle fracture dislocation - admit to [facility] for OR later today.  Continue NWB and NPO.  Admission orders entered.  To OR this afternoon for urgent closed reduction and external fixation.  We'll then have to let the Eliquus wear off and decide about further operative treatment.  She understands the plan and agrees.  The risks and benefits of the alternative treatment options have been discussed in detail.  The patient wishes to proceed with surgery and specifically understands risks of bleeding, infection, nerve damage, blood clots, need for additional surgery, amputation and death.   Wylene Simmer, MD 2015/10/29, 3:45 AM

## 2015-10-17 NOTE — ED Notes (Signed)
Blood draw delayed, [facility]

## 2015-10-17 NOTE — ED Notes (Signed)
Report given to Brunswick CorporationLordis RN

## 2015-10-18 ENCOUNTER — Other Ambulatory Visit: Payer: Self-pay | Admitting: Orthopedic Surgery

## 2015-10-18 ENCOUNTER — Inpatient Hospital Stay (HOSPITAL_COMMUNITY): Payer: Medicare (Managed Care)

## 2015-10-18 ENCOUNTER — Encounter (HOSPITAL_COMMUNITY): Payer: Self-pay | Admitting: Orthopedic Surgery

## 2015-10-18 DIAGNOSIS — J9601 Acute respiratory failure with hypoxia: Secondary | ICD-10-CM

## 2015-10-18 DIAGNOSIS — I10 Essential (primary) hypertension: Secondary | ICD-10-CM

## 2015-10-18 DIAGNOSIS — I482 Chronic atrial fibrillation: Secondary | ICD-10-CM

## 2015-10-18 DIAGNOSIS — E119 Type 2 diabetes mellitus without complications: Secondary | ICD-10-CM

## 2015-10-18 DIAGNOSIS — I4891 Unspecified atrial fibrillation: Secondary | ICD-10-CM

## 2015-10-18 LAB — GLUCOSE, CAPILLARY
GLUCOSE-CAPILLARY: 140 mg/dL — AB (ref 65–99)
GLUCOSE-CAPILLARY: 133 mg/dL — AB (ref 65–99)
GLUCOSE-CAPILLARY: 130 mg/dL — AB (ref 65–99)
GLUCOSE-CAPILLARY: 168 mg/dL — AB (ref 65–99)

## 2015-10-18 LAB — HEMOGLOBIN A1C
HEMOGLOBIN A1C: 6.8 % — AB (ref 4.8–5.6)
MEAN PLASMA GLUCOSE: 148 mg/dL

## 2015-10-18 MED ORDER — HEPARIN (PORCINE) IN NACL 100-0.45 UNIT/ML-% IJ SOLN
1500.0000 [IU]/h | INTRAMUSCULAR | Status: DC
Start: 2015-10-18 — End: 2015-10-22
  Administered 2015-10-18 – 2015-10-21 (×4): 1500 [IU]/h via INTRAVENOUS
  Filled 2015-10-18 (×8): qty 250

## 2015-10-18 MED ORDER — SENNA 8.6 MG PO TABS: 2.0000 | ORAL_TABLET | Freq: Two times a day (BID) | ORAL | Status: AC

## 2015-10-18 MED ORDER — OXYCODONE HCL 5 MG PO TABS: 5.0000 mg | ORAL_TABLET | ORAL | Status: AC | PRN

## 2015-10-18 MED ORDER — DOCUSATE SODIUM 100 MG PO CAPS: 100.0000 mg | ORAL_CAPSULE | Freq: Two times a day (BID) | ORAL | Status: AC

## 2015-10-18 MED ORDER — INSULIN ASPART 100 UNIT/ML ~~LOC~~ SOLN
0.0000 [IU] | Freq: Three times a day (TID) | SUBCUTANEOUS | Status: DC
  Administered 2015-10-18: 1 [IU] via SUBCUTANEOUS
  Administered 2015-10-20: 2 [IU] via SUBCUTANEOUS
  Administered 2015-10-20: 1 [IU] via SUBCUTANEOUS
  Administered 2015-10-21: 2 [IU] via SUBCUTANEOUS
  Administered 2015-10-23: 1 [IU] via SUBCUTANEOUS
  Administered 2015-10-23: 2 [IU] via SUBCUTANEOUS
  Administered 2015-10-25: 1 [IU] via SUBCUTANEOUS

## 2015-10-18 MED ORDER — FUROSEMIDE 10 MG/ML IJ SOLN
20.0000 mg | Freq: Once | INTRAMUSCULAR | Status: AC
  Administered 2015-10-18: 20 mg via INTRAVENOUS
  Filled 2015-10-18: qty 2

## 2015-10-18 NOTE — Progress Notes (Signed)
Paged MD regarding lasix per his discussion with pt and family. Pt needs a chest xray before lasix is ordered per MD

## 2015-10-18 NOTE — Evaluation (Signed)
Physical Therapy Evaluation Patient Details Name: Marisa Tran MRN: [id number] DOB: 11/27/1945 Today's Date: 10/18/2015   History of Present Illness  Pt is a 70 y.o. female now s/p fall with resulting Rt ankle fracture and now s/p external fixator application. PMH: hypertension, atrial fibrillation.   Clinical Impression  At this time the patient and family are reporting that they are planning to stay in [place] until the next surgery and then transport back home to a SNF for more rehabilitation. PT will continue to follow to progress mobility acutely. No equipment recommendations made at this time as anticipating D/C to SNF. If discharge plans change, DME recommendations would need to be made. PT to continue to follow.     Follow Up Recommendations SNF;Supervision for mobility/OOB    Equipment Recommendations  None recommended by PT (to be assessed at next venue. )    Recommendations for Other Services       Precautions / Restrictions Precautions Precautions: Fall Precaution Comments: external fixator  Restrictions Weight Bearing Restrictions: Yes RLE Weight Bearing: Non weight bearing      Mobility  Bed Mobility Overal bed mobility: Needs Assistance Bed Mobility: Supine to Sit;Sit to Supine     Supine to sit: HOB elevated;Min guard (using rail) Sit to supine: Min assist (min assist with Rt LE)   General bed mobility comments: Pt sat EOB approx. 10 minutes. Pt with complaints of feeling lightheaded and ultimately requesting return to supine.   Transfers                 General transfer comment: not attempted at this time.   Ambulation/Gait                Stairs            Wheelchair Mobility    Modified Rankin (Stroke Patients Only)       Balance Overall balance assessment: Needs assistance Sitting-balance support: No upper extremity supported Sitting balance-Leahy Scale: Good                                       Pertinent  Vitals/Pain Pain Assessment: 0-10 Pain Score: 7  Pain Location: Rt leg Pain Descriptors / Indicators: Aching;Sharp Pain Intervention(s): Limited activity within patient's tolerance;Monitored during session    Home Living Family/patient expects to be discharged to:: Skilled nursing facility Living Arrangements: Spouse/significant other               Additional Comments: Pt is from [place] and was taking care of her daughter's children when she fell. Currently the patient is planning to stay until after her next surgery and then transport back to [place] to a SNF for further rehabilitation.     Prior Function Level of Independence: Independent               Hand Dominance        Extremity/Trunk Assessment   Upper Extremity Assessment: Overall WFL for tasks assessed           Lower Extremity Assessment: RLE deficits/detail RLE Deficits / Details: min assist to raise Rt LE        Communication   Communication: No difficulties  Cognition Arousal/Alertness: Awake/alert Behavior During Therapy: WFL for tasks assessed/performed Overall Cognitive Status: Within Functional Limits for tasks assessed  General Comments      Exercises        Assessment/Plan    PT Assessment Patient needs continued PT services  PT Diagnosis Difficulty walking;Generalized weakness;Acute pain   PT Problem List Decreased range of motion;Decreased strength;Decreased activity tolerance;Decreased balance;Decreased mobility  PT Treatment Interventions DME instruction;Gait training;Functional mobility training;Therapeutic exercise;Therapeutic activities;Patient/family education   PT Goals (Current goals can be found in the Care Plan section) Acute Rehab PT Goals Patient Stated Goal: Get surgery done and get back home PT Goal Formulation: With patient/family Time For Goal Achievement: 11/01/15 Potential to Achieve Goals: Good    Frequency Min 2X/week    Barriers to discharge        Co-evaluation               End of Session   Activity Tolerance: Other (comment) (limited by pt reports of feeling lightheaded. ) Patient left: in bed;with call bell/phone within reach;with family/visitor present (Rt LE elevated) Nurse Communication: Mobility status;Weight bearing status         Time: 1112-1150 PT Time Calculation (min) (ACUTE ONLY): 38 min   Charges:   PT Evaluation $PT Eval Moderate Complexity: 1 Procedure PT Treatments $Neuromuscular Re-education: 23-37 mins   PT G Codes:        Christiane Ha, PT, CSCS Pager [phone] Office [phone]  10/18/2015, 2:50 PM

## 2015-10-18 NOTE — Progress Notes (Signed)
Subjective: 1 Day Post-Op Procedure(s) (LRB): CLOSED REDUCTION ANKLE (Right) EXTERNAL FIXATION RIGHT ANKLE (Right)  Patient reports pain as mild to moderate.  Beginning to tolerate POs better since surgery.  Denies BM, however admits to flatulence.  Denies fever chills, N/V.    Objective:   VITALS:  Temp:  [97.4 F (36.3 C)-98.4 F (36.9 C)] 98.1 F (36.7 C) (04/21 0554) Pulse Rate:  [66-119] 80 (04/21 0554) Resp:  [13-21] 19 (04/21 0554) BP: (121-193)/(59-94) 132/72 mmHg (04/21 0554) SpO2:  [93 %-99 %] 93 % (04/21 0554)  General: WDWN patient in NAD. Psych:  Appropriate mood and affect. Neuro:  A&O x 3, Moving all extremities, sensation intact to light touch HEENT:  EOMs intact Chest:  Even non-labored respirations Skin:  Dressing and ex-fix C/D/I, no rashes or lesions Extremities: warm/dry, mild edema, no erythema or echymosis.  No lymphadenopathy. Pulses: Popliteus 2+ MSK:  ROM: EHL/FHL intact, MMT: patient can perform quad set    LABS  Recent Labs  10/17/15 0227  HGB 14.2  WBC 12.7*  PLT 241    Recent Labs  10/17/15 0227  NA 139  K 3.7  CL 103  CO2 25  BUN 31*  CREATININE 1.04*  GLUCOSE 200*   No results for input(s): LABPT, INR in the last 72 hours.   Assessment/Plan: 1 Day Post-Op Procedure(s) (LRB): CLOSED REDUCTION ANKLE (Right) EXTERNAL FIXATION RIGHT ANKLE (Right)  Up with therapy  NWB RLE Pt is from [place] and expresses concern over finances due to insurance coverage.  She and her daughter are sorting through those issues. Once eliquis has cleared patient's system and swelling has decreased, plan for ORIF of R ankle fx. D/C plans TBD.  Alfredo MartinezJustin Gwenlyn Hottinger, PA-C, [facility] Office:  [phone]

## 2015-10-18 NOTE — Progress Notes (Signed)
Radiology will conduct cxr tonight

## 2015-10-18 NOTE — Progress Notes (Signed)
Offer Pt. A bath but Pt. Stated she does not want a bath at this time but would like to get one after breakfast or lunch, Inform the pt. Whenever she is ready to let the NT know and we can surely assist her with the bath

## 2015-10-18 NOTE — Consult Note (Signed)
Medical Consultation   Marisa Tran  XLK:[id number]  DOB: 1945-07-31  DOA: 10/16/2015  PCP: Patient visiting from [place] Outpatient Specialists: Patient visiting from [place] Requesting physician: Toni Arthurs, MD  Reason for consultation: Diabetes and hypoxia   History of Present Illness: Marisa Tran is an 70 y.o. female with a history of HTN, morbid obesity, chronic sinus problems, and atrial fibrillation on Eloquis. Patient from [place] but in [place] visiting family. She fell sustaining a right ankle fracture Wed and is s/p reduction and external fixation yesterday.   Patient's PCP in [place] has been monitoring blood glucose. Her last A1C was 6.5 early April. Patient trying to manage with diet before starting medications. She was found to be in atrial fibrillation at routine visit with PCP 6 weeks ago.   Following ankle surgery yesterday patient had a dry mouth, felt short of breath. Her 02 sats fell in the 80s today, 02 per [place] placed. Patient hasn't felt SOB except for in the immediate post-op period. No cough. No chest pain. No underlying lung disease. She takes HCTZ on a daily basis at home. The only time she has been SOB at home is when she skipped HCTZ dose. No known history of heart failure. Patient has fallen a couple of times of the last few years which she attributes to vision problems.    Review of Systems:  Review of Systems  Constitutional: Negative.   HENT: Negative.   Respiratory: Negative.   Cardiovascular: Negative.   Gastrointestinal: Negative.   Genitourinary: Negative.   Musculoskeletal: Positive for falls.  Skin: Negative.   Neurological: Negative.   Endo/Heme/Allergies: Negative.   Psychiatric/Behavioral: Negative.     Past Medical History: Past Medical History  Diagnosis Date  . Hypertension   . Atrial fibrillation (HCC)   . Chronic sinus infection     Past Surgical History: Past Surgical History  Procedure Laterality Date  . Cataract  extraction, bilateral    . Ankle closed reduction Right 10/17/2015    Procedure: CLOSED REDUCTION ANKLE;  Surgeon: Toni Arthurs, MD;  Location: [facility] OR;  Service: Orthopedics;  Laterality: Right;  . External fixation leg Right 10/17/2015    Procedure: EXTERNAL FIXATION RIGHT ANKLE;  Surgeon: Toni Arthurs, MD;  Location: [facility] OR;  Service: Orthopedics;  Laterality: Right;     Allergies  Allergen Reactions  . Iodine Anaphylaxis   Social History:  reports that she has never smoked. She does not have any smokeless tobacco history on file. She reports that she drinks alcohol. Her drug history is not on file.  Family History: Mother a live - bladder cancer No other known illness in family  Physical Exam: Filed Vitals:   10/17/15 2115 10/18/15 0148 10/18/15 0554 10/18/15 1441  BP:  134/75 132/72 111/61  Pulse:  66 80 85  Temp:  97.9 F (36.6 C) 98.1 F (36.7 C) 97.3 F (36.3 C)  TempSrc:  Oral Oral Oral  Resp:  Height:      Weight:      SpO2: 93% 93% 93% 90%    Constitutional:  Pleasant white obese white female Alert and awake, oriented x3, not in any acute distress. Daughter and husband in room Eyes: PER,  irises appear normal, anicteric sclera,  ENMT: external ears and nose appear norma. Lips appears normal, oropharynx mucosa, tongue, posterior pharynx appear normal  Neck: neck appears normal, no masses, normal ROM CVS: S1-S2  clear, no murmur rubs or gallops, no LE edema, normal pedal pulses. Trace bilateral hand edema Respiratory:  clear to auscultation bilaterally, no wheezing, rales or rhonchi. Respiratory effort normal. No accessory muscle use.  Abdomen: soft nontender, nondistended, normal bowel sounds, no hepatosplenomegaly, no hernias  Musculoskeletal: : no cyanosis, clubbing or edema noted bilaterally Neuro: Cranial nerves II-XII intact, strength, sensation, reflexes Psych: judgement and insight appear normal, stable mood and affect, mental status Skin: no rashes or  lesions or ulcers, no induration or nodules    Data reviewed:  I have personally reviewed following labs and imaging studies Labs:  CBC:  Recent Labs Lab 10/17/15 0227  WBC 12.7*  NEUTROABS 10.5*  HGB 14.2  HCT 41.5  MCV 83.8  PLT 241    Basic Metabolic Panel:  Recent Labs Lab 10/17/15 0227  NA 139  K 3.7  CL 103  CO2 25  GLUCOSE 200*  BUN 31*  CREATININE 1.04*  CALCIUM 8.4*   GFR Estimated Creatinine Clearance: 73.3 mL/min (by C-G formula based on Cr of 1.04).  CBG:  Recent Labs Lab 10/17/15 1832 10/17/15 2135 10/18/15 0727 10/18/15 1228  GLUCAP 171* 181* 140* 133*   Hgb A1c  Recent Labs  10/17/15 1920  HGBA1C 6.8*   Microbiology Recent Results (from the past 240 hour(s))  Surgical pcr screen     Status: None   Collection Time: 10/17/15  5:59 AM  Result Value Ref Range Status   MRSA, PCR NEGATIVE NEGATIVE Final   Staphylococcus aureus NEGATIVE NEGATIVE Final    Comment:        The Xpert SA Assay (FDA approved for NASAL specimens in patients over 63 years of age), is one component of a comprehensive surveillance program.  Test performance has been validated by [facility] for patients greater than or equal to 9 year old. It is not intended to diagnose infection nor to guide or monitor treatment.        Inpatient Medications:   Scheduled Meds: . amLODipine  2.5 mg Oral Daily  . atenolol  100 mg Oral Daily  . citalopram  20 mg Oral Daily  . dextromethorphan-guaiFENesin  1 tablet Oral BID  . docusate sodium  100 mg Oral BID  . insulin aspart  0-20 Units Subcutaneous TID WC  . pantoprazole  40 mg Oral Daily  . senna  1 tablet Oral BID  . triamterene-hydrochlorothiazide  1 tablet Oral Daily    Radiological Exams on Admission: Dg Tibia/fibula Right  10/17/2015  CLINICAL DATA:  Twisting injury to the right ankle after a fall. Pain and deformity. EXAM: RIGHT TIBIA AND FIBULA - 2 VIEW COMPARISON:  None. FINDINGS: The distal right  tibia and fibula are not included within the field of view and are evaluated on separate imaging of the right ankle. See additional report. The visualized proximal and midportion of the right tibia and fibula appear intact without additional fractures identified. Right knee joint is not displaced with mild degenerative changes present. Visualized soft tissues are unremarkable. IMPRESSION: No additional fractures demonstrated to the proximal/mid right tibia and fibula. Electronically Signed   By: Burman Nieves M.D.   On: 10/17/2015 00:02   Dg Ankle 2 Views Right  10/17/2015  CLINICAL DATA:  Ankle fracture postreduction. EXAM: RIGHT ANKLE - 2 VIEW COMPARISON:  Pre reduction exam earlier this day. FINDINGS: Fracture dislocation of the ankle. There is improved alignment, however the talus remains posterior dislocated with respect to the tibia. There is persistent apex medial  angulation. The medial malleolar fragment likely remains aligned with the talus. Displaced fibular fracture in improved alignment with persistent angulation. Overlying splint material in place. IMPRESSION: Some improvement in alignment of the ankle fracture dislocation postreduction, however persistent dislocation of the tibial talar joint. Electronically Signed   By: Rubye OaksMelanie  Ehinger M.D.   On: 10/17/2015 02:39   Dg Ankle Complete Right  10/16/2015  CLINICAL DATA:  Trip and fall injury on steps, twisting the right ankle. Deformity and pain. EXAM: RIGHT ANKLE - COMPLETE 3+ VIEW COMPARISON:  None. FINDINGS: Severe comminuted and displaced fractures of the right ankle. Fractures of the medial malleolus of the right ankle with lateral displacement of the distal fracture fragment. Fracture of the anterior malleolus of the distal tibia with a small avulsion fracture off of the posterior malleolus of the distal tibia. There is complete dislocation of the talus with respect to the tibia posteriorly and near complete dislocation of the talus with  respect to the tibia laterally. Fracture of the distal right fibula with lateral displacement, overriding, and angulation of the distal fracture fragment. Posterior dislocation of the distal fracture fragment. The talar dome appears intact. Small ossicle over the dorsal aspect of the distal talus likely represents an avulsion fracture at the insertion of the anterior talofibular ligament. IMPRESSION: Severe comminuted and displaced fractures of the right ankle as described above. Electronically Signed   By: Burman NievesWilliam  Stevens M.D.   On: 10/16/2015 23:59   Dg Foot 2 Views Right  10/17/2015  CLINICAL DATA:  Twisting injury to the right ankle after a fall. Pain and deformity. EXAM: RIGHT FOOT - 2 VIEW COMPARISON:  None. FINDINGS: Severe comminuted and displaced fractures of the right ankle. See additional report of right ankle from today's date. Right foot appears otherwise intact without additional fractures demonstrated. Due to patient positioning, the Lisfranc joint is not well evaluated and injury here is not excluded radiographically. IMPRESSION: Severely comminuted and displaced fractures of the right ankle. See additional report. No additional fractures demonstrated on limited imaging of right foot. Electronically Signed   By: Burman NievesWilliam  Stevens M.D.   On: 10/17/2015 00:01    Impression/Recommendations  Fracture dislocation of ankle, s/p reduction and external fixation yesterday. She is scheduled for definitive surgery Tuesday afternoon.   Acute respiratory failure with hypoxia. 02 sats dipped into 80's on room air today. Patient was asymptomatic. Sats now in upper 90's on 2 liters. Patient denies any SOB, cough, or chest pain. Doubt PE as patient had been on Eloquis until two days ago.  -2 View CXR now. -IV fluids have already been discontinued.  Some edema in hands today. She is 1.5 liters + fluid balance. Getting her home dose of Maxide. If CXR suggests volume overload will give dose of lasix.    -monitor I and 0 -Daily weight -Wean 02 as tolerated -incentive spirometry   Diabetes mellitus, type 2 (HCC). On surveillance program by her PCP. Trying to manage with diet. A1c today is 6.8.  -CBGs ac and HS with SSI ( sensitive since insulin naive).  -carb modified diet.  Atrial fibrillation (HCC). Eloquis on hold for surgery Tuesday -Place on telemetry.  -Will start Heparin gtt (without bolus) per pharmacy    Hypertension.  Elevated yesterday, improved today.  -continue home tenormin, norvasc   Thank you for this consultation.  [facility] hospitalist team will follow the patient with you.   Time Spent: 45 minutes  Willette ClusterPaula Ladislav Caselli M.D. [facility] 10/18/2015, 4:31 PM

## 2015-10-18 NOTE — Progress Notes (Signed)
Chest x ray ordered for tonight by MD to evaluate atelectasias. IV fluids discontinued

## 2015-10-18 NOTE — Care Management Note (Addendum)
Case Management Note  Patient Details  Name: Marisa Tran MRN: [id number] Date of Birth: 01/19/1946  Subjective/Objective:                    Action/Plan:  Spoke with patient's daughter from [place] .  PT to see patient today early afternoon , will await recommendations. Patient's daughter calling finance department and patient's insurance company , because patient is out of network .  Once eliquis has cleared patient's system and swelling has decreased, plan for ORIF of R ankle fx.  Will await PT eval and surgical plans . At present daughter thinking her mother will need short term rehab .  Spoke with CM , CMA she will email notification department to have them notify patient's insurance that she is here ( they have 24 hours from admission to do so) . Then CMA will send insurance patient's clinicals .   Patient's daughter requesting patient's next surgery to be preauthorized . CMA will also contact Pre authorization Department for same.     Patient visiting from [place] . Patient to have more surgery Tuesday April 25. Daughter wants patient to go to SNF in [place] for short term rehab . Daughter would like SW to assist in finding a SNF in [place] . Daughter will transport patient to SNF in [place] Expected Discharge Date:  10/21/15               Expected Discharge Plan:     In-House Referral:     Discharge planning Services     Post Acute Care Choice:    Choice offered to:     DME Arranged:    DME Agency:     HH Arranged:    HH Agency:     Status of Service:  In process, will continue to follow  Medicare Important Message Given:    Date Medicare IM Given:    Medicare IM give by:    Date Additional Medicare IM Given:    Additional Medicare Important Message give by:     If discussed at Long Length of Stay Meetings, dates discussed:    Additional Comments:  Kingsley PlanWile, [name], RN 10/18/2015, 10:40 AM

## 2015-10-18 NOTE — Progress Notes (Signed)
ANTICOAGULATION CONSULT NOTE - Initial Consult  Pharmacy Consult for Eliquis to Heparin Indication: atrial fibrillation  Allergies  Allergen Reactions  . Iodine Anaphylaxis    Patient Measurements: Height: 5\' 5"  (165.1 cm) Weight: (!) 319 lb 14.4 oz (145.106 kg) IBW/kg (Calculated) : 57 Heparin Dosing Weight: 100 kg  Vital Signs: Temp: 97.3 F (36.3 C) (04/21 1441) Temp Source: Oral (04/21 1441) BP: 111/61 mmHg (04/21 1441) Pulse Rate: 85 (04/21 1441)  Labs:  Recent Labs  10/17/15 0227  HGB 14.2  HCT 41.5  PLT 241  CREATININE 1.04*    Estimated Creatinine Clearance: 73.3 mL/min (by C-G formula based on Cr of 1.04).   Medical History: Past Medical History  Diagnosis Date  . Hypertension   . Atrial fibrillation (HCC)   . Chronic sinus infection     Assessment: 70 year old female on apixaban PTA for Afib (last dose 4/19) to transition to heparin for ankle surgery planned for Tuesday.  She is s/p placement of external fixator and will need further treatment.  Goal of Therapy:  Heparin level 0.3-0.7 units/ml Monitor platelets by anticoagulation protocol: Yes   Plan:  Heparin at 1500 units / hr Daily PTT, CBC, Heparin level for now (to determine if correlating)  Thank you Okey RegalLisa Ryzen Deady, PharmD [phone]  10/18/2015,4:43 PM

## 2015-10-18 NOTE — Progress Notes (Signed)
Pt now on 2liters oxygen via nasal canula d/t dropping oxygen sats to the mid 80s on room air.

## 2015-10-19 DIAGNOSIS — S82851D Displaced trimalleolar fracture of right lower leg, subsequent encounter for closed fracture with routine healing: Secondary | ICD-10-CM

## 2015-10-19 LAB — CBC
HEMATOCRIT: 41.9 % (ref 36.0–46.0)
Hemoglobin: 13 g/dL (ref 12.0–15.0)
MCH: 27.7 pg (ref 26.0–34.0)
MCHC: 31 g/dL (ref 30.0–36.0)
MCV: 89.1 fL (ref 78.0–100.0)
PLATELETS: 165 10*3/uL (ref 150–400)
RBC: 4.7 MIL/uL (ref 3.87–5.11)
RDW: 14.4 % (ref 11.5–15.5)
WBC: 10.8 10*3/uL — AB (ref 4.0–10.5)

## 2015-10-19 LAB — GLUCOSE, CAPILLARY
GLUCOSE-CAPILLARY: 104 mg/dL — AB (ref 65–99)
GLUCOSE-CAPILLARY: 115 mg/dL — AB (ref 65–99)
GLUCOSE-CAPILLARY: 143 mg/dL — AB (ref 65–99)
Glucose-Capillary: 116 mg/dL — ABNORMAL HIGH (ref 65–99)

## 2015-10-19 LAB — APTT
APTT: 85 s — AB (ref 24–37)
aPTT: 85 seconds — ABNORMAL HIGH (ref 24–37)

## 2015-10-19 LAB — HEPARIN LEVEL (UNFRACTIONATED): HEPARIN UNFRACTIONATED: 0.97 [IU]/mL — AB (ref 0.30–0.70)

## 2015-10-19 NOTE — Progress Notes (Signed)
PROGRESS NOTE                                                                                                                                                                                                             Patient Demographics:    Marisa Tran, is a 70 y.o. female, DOB - 11/26/1945, ONG:[id number]  Admit date - 10/16/2015   Admitting Physician Toni Arthurs, MD  Outpatient Primary MD for the patient is Pcp Not In System  LOS - 2         Brief Narrative   70 year old morbidly obese female with history of A. fib on Eliquis admitted for fall with right ankle fracture. Underwent external right ankle fixation. Patient developed acute hypoxia with O2 sat in high 80s and hospitalist consulted.   Subjective:   Patient seen and examined. Denies any chest discomfort or shortness of breath. Reports not having bowel movement.   Assessment  & Plan :    Active Problems:   Fracture of ankle, trimalleolar, right, closed Management per primary team. Status post reduction and external fixation on 4/20. Definitive Surgery on 4/25, once Eliquis wears off from the system and swelling of the right ankle used.    Acute respiratory failure with hypoxia (HCC) Suspected due to atelectasis. Chest x-ray showing low lung volumes, no infiltrate or effusion. Ordered bedside spirometry. Maintaining O2 sat on room air.    Diabetes mellitus, type 2 (HCC) Diet controlled. A1c of 6.8. Continue sliding skilled coverage    Atrial fibrillation (HCC) Rate controlled. Eliquis on hold for surgery. Placed on IV heparin  Essential hypertension Continue home medications. Stable  No further recommendations. We'll sign off. Please call for any questions.       Code Status : Full code  Family Communication  : None at bedside  Disposition Plan  : Per primary  Barriers For Discharge :     DVT Prophylaxis  : IV heparin  Lab Results    Component Value Date   PLT 165 10/19/2015    Antibiotics  :  Anti-infectives    Start     Dose/Rate Route Frequency Ordered Stop   10/17/15 1345  ceFAZolin (ANCEF) 3 g in dextrose 5 % 50 mL IVPB  Status:  Discontinued     3 g 130 mL/hr over 30 Minutes Intravenous To ShortStay Surgical  10/17/15 1338 10/17/15 1815        Objective:   Filed Vitals:   10/18/15 1441 10/18/15 1743 10/18/15 2139 10/19/15 0359  BP: 111/61 143/67 137/72 149/70  Pulse: 85 78 77 72  Temp: 97.3 F (36.3 C) 98.5 F (36.9 C) 98.3 F (36.8 C) 97.7 F (36.5 C)  TempSrc: Oral Oral Oral Oral  Resp: Height:      Weight:    149.365 kg (329 lb 4.6 oz)  SpO2: 90% 96% 92% 100%    Wt Readings from Last 3 Encounters:  10/19/15 149.365 kg (329 lb 4.6 oz)     Intake/Output Summary (Last 24 hours) at 10/19/15 1117 Last data filed at 10/19/15 1100  Gross per 24 hour  Intake   1520 ml  Output   3000 ml  Net  -1480 ml     Physical Exam  Gen: Elderly morbidly obese female not in distress HEENT: no pallor, moist mucosa, supple neck Chest: clear b/l, no added sounds CVS: N S1&S2, no murmurs, rubs or gallop GI: soft, NT, ND, BS+ Musculoskeletal: Dressing over left ankle CNS: Alert and oriented    Data Review:    CBC  Recent Labs Lab 10/17/15 0227 10/19/15 0542  WBC 12.7* 10.8*  HGB 14.2 13.0  HCT 41.5 41.9  PLT 241 165  MCV 83.8 89.1  MCH 28.7 27.7  MCHC 34.2 31.0  RDW 14.2 14.4  LYMPHSABS 1.4  --   MONOABS 0.7  --   EOSABS 0.1  --   BASOSABS 0.0  --     Chemistries   Recent Labs Lab 10/17/15 0227  NA 139  K 3.7  CL 103  CO2 25  GLUCOSE 200*  BUN 31*  CREATININE 1.04*  CALCIUM 8.4*   ------------------------------------------------------------------------------------------------------------------ No results for input(s): CHOL, HDL, LDLCALC, TRIG, CHOLHDL, LDLDIRECT in the last 72 hours.  Lab Results  Component Value Date   HGBA1C 6.8* 10/17/2015    ------------------------------------------------------------------------------------------------------------------ No results for input(s): TSH, T4TOTAL, T3FREE, THYROIDAB in the last 72 hours.  Invalid input(s): FREET3 ------------------------------------------------------------------------------------------------------------------ No results for input(s): VITAMINB12, FOLATE, FERRITIN, TIBC, IRON, RETICCTPCT in the last 72 hours.  Coagulation profile No results for input(s): INR, PROTIME in the last 168 hours.  No results for input(s): DDIMER in the last 72 hours.  Cardiac Enzymes No results for input(s): CKMB, TROPONINI, MYOGLOBIN in the last 168 hours.  Invalid input(s): CK ------------------------------------------------------------------------------------------------------------------ No results found for: BNP  Inpatient Medications  Scheduled Meds: . amLODipine  2.5 mg Oral Daily  . atenolol  100 mg Oral Daily  . citalopram  20 mg Oral Daily  . dextromethorphan-guaiFENesin  1 tablet Oral BID  . docusate sodium  100 mg Oral BID  . insulin aspart  0-9 Units Subcutaneous TID WC  . pantoprazole  40 mg Oral Daily  . senna  1 tablet Oral BID  . triamterene-hydrochlorothiazide  1 tablet Oral Daily   Continuous Infusions: . heparin 1,500 Units/hr (10/19/15 0923)   PRN Meds:.acetaminophen **OR** acetaminophen, HYDROmorphone (DILAUDID) injection, metoCLOPramide **OR** metoCLOPramide (REGLAN) injection, ondansetron **OR** ondansetron (ZOFRAN) IV, oxyCODONE  Micro Results Recent Results (from the past 240 hour(s))  Surgical pcr screen     Status: None   Collection Time: 10/17/15  5:59 AM  Result Value Ref Range Status   MRSA, PCR NEGATIVE NEGATIVE Final   Staphylococcus aureus NEGATIVE NEGATIVE Final    Comment:        The Xpert SA Assay (FDA approved for NASAL  specimens in patients over 71 years of age), is one component of a comprehensive surveillance program.  Test  performance has been validated by [facility] for patients greater than or equal to 36 year old. It is not intended to diagnose infection nor to guide or monitor treatment.     Radiology Reports Dg Tibia/fibula Right  10/17/2015  CLINICAL DATA:  Twisting injury to the right ankle after a fall. Pain and deformity. EXAM: RIGHT TIBIA AND FIBULA - 2 VIEW COMPARISON:  None. FINDINGS: The distal right tibia and fibula are not included within the field of view and are evaluated on separate imaging of the right ankle. See additional report. The visualized proximal and midportion of the right tibia and fibula appear intact without additional fractures identified. Right knee joint is not displaced with mild degenerative changes present. Visualized soft tissues are unremarkable. IMPRESSION: No additional fractures demonstrated to the proximal/mid right tibia and fibula. Electronically Signed   By: Burman Nieves M.D.   On: 10/17/2015 00:02   Dg Ankle 2 Views Right  10/17/2015  CLINICAL DATA:  Ankle fracture postreduction. EXAM: RIGHT ANKLE - 2 VIEW COMPARISON:  Pre reduction exam earlier this day. FINDINGS: Fracture dislocation of the ankle. There is improved alignment, however the talus remains posterior dislocated with respect to the tibia. There is persistent apex medial angulation. The medial malleolar fragment likely remains aligned with the talus. Displaced fibular fracture in improved alignment with persistent angulation. Overlying splint material in place. IMPRESSION: Some improvement in alignment of the ankle fracture dislocation postreduction, however persistent dislocation of the tibial talar joint. Electronically Signed   By: Rubye Oaks M.D.   On: 10/17/2015 02:39   Dg Ankle Complete Right  10/16/2015  CLINICAL DATA:  Trip and fall injury on steps, twisting the right ankle. Deformity and pain. EXAM: RIGHT ANKLE - COMPLETE 3+ VIEW COMPARISON:  None. FINDINGS: Severe comminuted and displaced  fractures of the right ankle. Fractures of the medial malleolus of the right ankle with lateral displacement of the distal fracture fragment. Fracture of the anterior malleolus of the distal tibia with a small avulsion fracture off of the posterior malleolus of the distal tibia. There is complete dislocation of the talus with respect to the tibia posteriorly and near complete dislocation of the talus with respect to the tibia laterally. Fracture of the distal right fibula with lateral displacement, overriding, and angulation of the distal fracture fragment. Posterior dislocation of the distal fracture fragment. The talar dome appears intact. Small ossicle over the dorsal aspect of the distal talus likely represents an avulsion fracture at the insertion of the anterior talofibular ligament. IMPRESSION: Severe comminuted and displaced fractures of the right ankle as described above. Electronically Signed   By: Burman Nieves M.D.   On: 10/16/2015 23:59   Dg Chest Port 1 View  10/18/2015  CLINICAL DATA:  Acute respiratory failure with hypoxia. Ankle fracture -dislocation. EXAM: PORTABLE CHEST 1 VIEW COMPARISON:  None. FINDINGS: Low lung volumes are seen however both lungs are clear. No evidence of pneumothorax or pleural effusion. No evidence of pulmonary consolidation or edema. Heart size is within normal limits. IMPRESSION: Low lung volumes.  No active disease. Electronically Signed   By: Myles Rosenthal M.D.   On: 10/18/2015 19:34   Dg Foot 2 Views Right  10/17/2015  CLINICAL DATA:  Twisting injury to the right ankle after a fall. Pain and deformity. EXAM: RIGHT FOOT - 2 VIEW COMPARISON:  None. FINDINGS: Severe comminuted and displaced  fractures of the right ankle. See additional report of right ankle from today's date. Right foot appears otherwise intact without additional fractures demonstrated. Due to patient positioning, the Lisfranc joint is not well evaluated and injury here is not excluded  radiographically. IMPRESSION: Severely comminuted and displaced fractures of the right ankle. See additional report. No additional fractures demonstrated on limited imaging of right foot. Electronically Signed   By: Burman NievesWilliam  Stevens M.D.   On: 10/17/2015 00:01    Time Spent in minutes  20   Eddie NorthHUNGEL, Kai Calico M.D on 10/19/2015 at 11:17 AM  Between 7am to 7pm - Pager - [phone]  After 7pm go to [email or web address] - password [email or web address]  [facility] -  Office  [phone]

## 2015-10-19 NOTE — Progress Notes (Signed)
ANTICOAGULATION CONSULT NOTE - Follow-up Consult  Pharmacy Consult for Eliquis to Heparin Indication: atrial fibrillation  Allergies  Allergen Reactions  . Iodine Anaphylaxis    Patient Measurements: Height: 5\' 5"  (165.1 cm) Weight: (!) 329 lb 4.6 oz (149.365 kg) IBW/kg (Calculated) : 57 Heparin Dosing Weight: 100 kg  Vital Signs: Temp: 97.7 F (36.5 C) (04/22 0359) Temp Source: Oral (04/22 0359) BP: 149/70 mmHg (04/22 0359) Pulse Rate: 72 (04/22 0359)  Labs:  Recent Labs  10/17/15 0227 10/19/15 0542  HGB 14.2 13.0  HCT 41.5 41.9  PLT 241 165  APTT  --  85*  HEPARINUNFRC  --  0.97*  CREATININE 1.04*  --     Estimated Creatinine Clearance: 74.7 mL/min (by C-G formula based on Cr of 1.04).  Assessment: 70 year old female on apixaban PTA for Afib (last dose 4/19) - transitioned to heparin bridge for ankle surgery planned for Tuesday. Appears that apixaban still affecting heparin level (0.97) so utilizing aPTT to monitor heparin. aPTT therapeutic (85 sec) on 1500 units/hr. CBC relatively stable. No bleeding noted.  Goal of Therapy:  Heparin level 0.3-0.7 units/ml Monitor platelets by anticoagulation protocol: Yes   Plan:  Continue heparin at 1500 units / hr Will f/u 6hr aPTT Daily heparin level and aPTT  Christoper Fabianaron Lanaiya Lantry, PharmD, BCPS Clinical pharmacist, pager [phone] 10/19/2015,6:53 AM

## 2015-10-19 NOTE — Evaluation (Signed)
Occupational Therapy Evaluation Patient Details Name: Marisa Tran MRN: [id number] DOB: Jan 29, 1946 Today's Date: 10/19/2015    History of Present Illness Pt is a 70 y.o. female now s/p fall with resulting Rt ankle fracture and now s/p external fixator application. PMH: hypertension, atrial fibrillation.    Clinical Impression   Pt admitted with above. She demonstrates the below listed deficits and will benefit from continued OT to maximize safety and independence with BADLs.  Pt presents to OT with generalized weakness and acute pain.  She currently requires max - total A for LB ADLs and mod A +2 for functional transfers (she was able to transfer to [facility] with mod A +1, but as she fatigued, required +2).  She demonstrates difficulty maintaining NWBing.  She states her plan is for daughter to transport she and spouse back to [facility], after surgery, where she will go to SNF for rehab.  Will follow acutely.       Follow Up Recommendations  SNF;Supervision/Assistance - 24 hour    Equipment Recommendations  None recommended by OT (unless pt is to discharge to family member's home )    Recommendations for Other Services       Precautions / Restrictions Precautions Precautions: Fall Precaution Comments: external fixator  Restrictions Weight Bearing Restrictions: Yes RLE Weight Bearing: Non weight bearing      Mobility Bed Mobility Overal bed mobility: Needs Assistance Bed Mobility: Supine to Sit;Sit to Supine     Supine to sit: Min assist Sit to supine: Min assist   General bed mobility comments: assist for Rt LE   Transfers Overall transfer level: Needs assistance   Transfers: Sit to/from Stand;Stand Pivot Transfers Sit to Stand: Mod assist;+2 physical assistance Stand pivot transfers: Mod assist;+2 physical assistance       General transfer comment: Pt initially required mod A +1, but as she fatigued, required mod A +2.  Difficulty maintaining NWB    Balance Overall  balance assessment: Needs assistance Sitting-balance support: Feet supported Sitting balance-Leahy Scale: Good     Standing balance support: Bilateral upper extremity supported Standing balance-Leahy Scale: Poor                              ADL Overall ADL's : Needs assistance/impaired Eating/Feeding: Independent   Grooming: Wash/dry hands;Wash/dry face;Oral care;Brushing hair;Set up;Sitting   Upper Body Bathing: Set up;Sitting   Lower Body Bathing: Maximal assistance;Sit to/from stand   Upper Body Dressing : Set up;Sitting   Lower Body Dressing: Total assistance;Sit to/from stand   Toilet Transfer: Moderate assistance;+2 for physical assistance;Stand-pivot;BSC;RW Toilet Transfer Details (indicate cue type and reason): Pt required mod A +1 to transfer to [facility], but fatitgued, requiring mod A +2 to transfer back to bed.  Pt with difficulty maintaining NWB  Toileting- Clothing Manipulation and Hygiene: Total assistance;Sit to/from stand       Functional mobility during ADLs: Moderate assistance;+2 for physical assistance       Vision     Perception     Praxis      Pertinent Vitals/Pain Pain Assessment: 0-10 Pain Score: 7  Pain Location: Rt LE  Pain Descriptors / Indicators: Aching Pain Intervention(s): Monitored during session;Premedicated before session     Hand Dominance     Extremity/Trunk Assessment Upper Extremity Assessment Upper Extremity Assessment: Generalized weakness   Lower Extremity Assessment Lower Extremity Assessment: Defer to PT evaluation       Communication Communication Communication: No difficulties  Cognition Arousal/Alertness: Awake/alert Behavior During Therapy: WFL for tasks assessed/performed Overall Cognitive Status: Within Functional Limits for tasks assessed                     General Comments       Exercises       Shoulder Instructions      Home Living Family/patient expects to be discharged  to:: Skilled nursing facility Living Arrangements: Spouse/significant other                               Additional Comments: [place]  Current plan is that daughter [place] [facility] and pt will go to SNF      Prior Functioning/Environment Level of Independence: Independent             OT Diagnosis: Generalized weakness;Acute pain   OT Problem List: Decreased strength;Decreased activity tolerance;Impaired balance (sitting and/or standing);Decreased safety awareness;Decreased knowledge of use of DME or AE;Decreased knowledge of precautions;Obesity;Pain   OT Treatment/Interventions: Self-care/ADL training;DME and/or AE instruction;Therapeutic exercise;Therapeutic activities;Patient/family education;Balance training    OT Goals(Current goals can be found in the care plan section) Acute Rehab OT Goals Patient Stated Goal: to regain independence  OT Goal Formulation: With patient Time For Goal Achievement: 11/02/15 Potential to Achieve Goals: Good ADL Goals Pt Will Perform Lower Body Bathing: with mod assist;sit to/from stand;with adaptive equipment Pt Will Perform Lower Body Dressing: with mod assist;with adaptive equipment;sit to/from stand Pt Will Transfer to Toilet: with mod assist;ambulating;regular height toilet;bedside commode;grab bars Pt Will Perform Toileting - Clothing Manipulation and hygiene: with mod assist;sit to/from stand;with adaptive equipment Pt/caregiver will Perform Home Exercise Program: Increased strength;Right Upper extremity;Left upper extremity;With theraband;With written HEP provided;Independently  OT Frequency: Min 2X/week   Barriers to D/C: Decreased caregiver support          Co-evaluation              End of Session Equipment Utilized During Treatment: Rolling walker Nurse Communication: Mobility status  Activity Tolerance: Patient limited by fatigue Patient left: in bed;with call bell/phone within  reach   Time: 1222-1258 OT Time Calculation (min): 36 min Charges:  OT General Charges $OT Visit: 1 Procedure OT Treatments $Self Care/Home Management : 8-22 mins G-Codes:    Ramonia Mcclaran M 10/19/2015, 2:06 PM

## 2015-10-19 NOTE — Progress Notes (Signed)
ANTICOAGULATION CONSULT NOTE - Follow-up Consult  Pharmacy Consult for Eliquis to Heparin Indication: atrial fibrillation  Allergies  Allergen Reactions  . Iodine Anaphylaxis    Patient Measurements: Height: 5\' 5"  (165.1 cm) Weight: (!) 329 lb 4.6 oz (149.365 kg) IBW/kg (Calculated) : 57 Heparin Dosing Weight: 100 kg  Vital Signs: Temp: 98.3 F (36.8 C) (04/22 1335) Temp Source: Oral (04/22 1335) BP: 132/68 mmHg (04/22 1335) Pulse Rate: 96 (04/22 1335)  Labs:  Recent Labs  10/17/15 0227 10/19/15 0542 10/19/15 1301  HGB 14.2 13.0  --   HCT 41.5 41.9  --   PLT 241 165  --   APTT  --  85* 85*  HEPARINUNFRC  --  0.97*  --   CREATININE 1.04*  --   --     Estimated Creatinine Clearance: 74.7 mL/min (by C-G formula based on Cr of 1.04).  Assessment: 70 year old female on apixaban PTA for Afib (last dose 4/19) - transitioned to heparin bridge for ankle surgery planned for Tuesday. Appears that apixaban still affecting heparin level (0.97) so utilizing aPTT to monitor heparin. APTT remains therapeutic (85 sec) on 1500 units/hr. CBC relatively stable. No bleeding noted.  Goal of Therapy:  Heparin level 0.3-0.7 units/ml Monitor platelets by anticoagulation protocol: Yes  Aptt: 66 - 102 s    Plan:  Continue heparin at 1500 units / hr Daily heparin level and aPTT  Vinnie LevelBenjamin Marcianna Daily, PharmD., BCPS Clinical Pharmacist Pager [phone]

## 2015-10-19 NOTE — Progress Notes (Signed)
Subjective: 2 Days Post-Op Procedure(s) (LRB): CLOSED REDUCTION ANKLE (Right) EXTERNAL FIXATION RIGHT ANKLE (Right)  Patient reports pain as mild to moderate.  Denies BM, however admits to flatus.  Denies fever chills, N/V.    Objective:   VITALS:  Temp:  [97.3 F (36.3 C)-98.5 F (36.9 C)] 97.7 F (36.5 C) (04/22 0359) Pulse Rate:  [72-85] 72 (04/22 0359) Resp:  [18-20] 18 (04/22 0359) BP: (111-149)/(61-72) 149/70 mmHg (04/22 0359) SpO2:  [90 %-100 %] 100 % (04/22 0359) Weight:  [149.365 kg (329 lb 4.6 oz)] 149.365 kg (329 lb 4.6 oz) (04/22 0359)  General: WDWN patient in NAD. Psych:  Appropriate mood and affect. Neuro:  A&O x 3, Moving all extremities, sensation intact to light touch HEENT:  EOMs intact Chest:  Even non-labored respirations Skin:  Dressing and ex-fix C/D/I, no rashes or lesions Extremities: warm/dry, mild edema, no erythema or echymosis.  No lymphadenopathy. Pulses: Popliteus 2+ MSK:  ROM: EHL/FHL intact, MMT: patient can perform quad set    LABS  Recent Labs  10/17/15 0227 10/19/15 0542  HGB 14.2 13.0  WBC 12.7* 10.8*  PLT 241 165    Recent Labs  10/17/15 0227  NA 139  K 3.7  CL 103  CO2 25  BUN 31*  CREATININE 1.04*  GLUCOSE 200*   No results for input(s): LABPT, INR in the last 72 hours.   Assessment/Plan: 2 Days Post-Op Procedure(s) (LRB): CLOSED REDUCTION ANKLE (Right) EXTERNAL FIXATION RIGHT ANKLE (Right)  Up with therapy  NWB RLE [place] from [place] and expresses concern over finances due to insurance coverage.  She and her daughter are sorting through those issues. Once eliquis has cleared patient's system and swelling has decreased, plan for ORIF of R ankle fx. D/C plans TBD.

## 2015-10-20 LAB — CBC
HEMATOCRIT: 40.1 % (ref 36.0–46.0)
HEMOGLOBIN: 12.6 g/dL (ref 12.0–15.0)
MCH: 27.5 pg (ref 26.0–34.0)
MCHC: 31.4 g/dL (ref 30.0–36.0)
MCV: 87.4 fL (ref 78.0–100.0)
Platelets: 181 10*3/uL (ref 150–400)
RBC: 4.59 MIL/uL (ref 3.87–5.11)
RDW: 14.4 % (ref 11.5–15.5)
WBC: 10.5 10*3/uL (ref 4.0–10.5)

## 2015-10-20 LAB — APTT: aPTT: 85 seconds — ABNORMAL HIGH (ref 24–37)

## 2015-10-20 LAB — GLUCOSE, CAPILLARY
GLUCOSE-CAPILLARY: 140 mg/dL — AB (ref 65–99)
GLUCOSE-CAPILLARY: 155 mg/dL — AB (ref 65–99)
GLUCOSE-CAPILLARY: 155 mg/dL — AB (ref 65–99)
Glucose-Capillary: 111 mg/dL — ABNORMAL HIGH (ref 65–99)

## 2015-10-20 LAB — HEPARIN LEVEL (UNFRACTIONATED): HEPARIN UNFRACTIONATED: 0.74 [IU]/mL — AB (ref 0.30–0.70)

## 2015-10-20 NOTE — Progress Notes (Signed)
Pharmacy continuing heparin drip at 6615ml/hr

## 2015-10-20 NOTE — Progress Notes (Signed)
Orthopedics Progress Note  Subjective: Patient doing better and had a BM this morning.  Spoke with her daughter from [place] who has a lot of questions concerning disposition and transfer to [place] when safe.  Objective:  Filed Vitals:   10/19/15 2221 10/20/15 0640  BP: 130/62 131/64  Pulse: 70 75  Temp: 98 F (36.7 C) 97.9 F (36.6 C)  Resp: 18 19    General: Awake and alert  Musculoskeletal: right ankle still swollen and bruised, immobilized in Mulitplanar Ex Fix, able to wiggle toes without pain Neurovascularly intact  Lab Results  Component Value Date   WBC 10.5 10/20/2015   HGB 12.6 10/20/2015   HCT 40.1 10/20/2015   MCV 87.4 10/20/2015   PLT 181 10/20/2015       Component Value Date/Time   NA 139 10/17/2015 0227   K 3.7 10/17/2015 0227   CL 103 10/17/2015 0227   CO2 25 10/17/2015 0227   GLUCOSE 200* 10/17/2015 0227   BUN 31* 10/17/2015 0227   CREATININE 1.04* 10/17/2015 0227   CALCIUM 8.4* 10/17/2015 0227   GFRNONAA 53* 10/17/2015 0227   GFRAA >60 10/17/2015 0227    No results found for: INR, PROTIME  Assessment/Plan: POD 2 s/p Procedure(s): CLOSED REDUCTION ANKLE EXTERNAL FIXATION RIGHT ANKLE Surgery pending swelling coming down. Patient feeling better and elevating her ankle and foot well. Tentative surgery planned for Tuesday per the family and Pharmacy note Patient currently on heparin and off Eloquis There are a lot of discharge and logistical questions that the patient and family have that I have deferred to Dr Renda RollsHewitt  Steven R. Ranell PatrickNorris, MD 10/20/2015 11:26 AM

## 2015-10-20 NOTE — Progress Notes (Signed)
ANTICOAGULATION CONSULT NOTE - Follow-up Consult  Pharmacy Consult for Eliquis to Heparin Indication: atrial fibrillation  Allergies  Allergen Reactions  . Iodine Anaphylaxis    Patient Measurements: Height: 5\' 5"  (165.1 cm) Weight: (!) 335 lb (151.955 kg) IBW/kg (Calculated) : 57 Heparin Dosing Weight: 100 kg  Vital Signs: Temp: 97.9 F (36.6 C) (04/23 0640) Temp Source: Oral (04/22 2221) BP: 131/64 mmHg (04/23 0640) Pulse Rate: 75 (04/23 0640)  Labs:  Recent Labs  10/19/15 0542 10/19/15 1301 10/20/15 0238  HGB 13.0  --  12.6  HCT 41.9  --  40.1  PLT 165  --  181  APTT 85* 85* 85*  HEPARINUNFRC 0.97*  --  0.74*    Estimated Creatinine Clearance: 75.5 mL/min (by C-G formula based on Cr of 1.04).  Assessment: 70 year old female on apixaban PTA for Afib (last dose 4/19) - transitioned to heparin bridge for ankle surgery planned for Tuesday. Appears that apixaban still affecting heparin level (0.74) so utilizing aPTT to monitor heparin. APTT remains therapeutic (85 sec) on 1500 units/hr. CBC relatively stable. RN reported no s/s of bleeding.  Goal of Therapy:  Heparin level 0.3-0.7 units/ml Monitor platelets by anticoagulation protocol: Yes  Aptt: 66 - 102 s    Plan:  Continue heparin at 1500 units / hr Daily heparin level and aPTT  Vinnie LevelBenjamin Othon Guardia, PharmD., BCPS Clinical Pharmacist Pager [phone]

## 2015-10-21 LAB — CBC
HCT: 38.3 % (ref 36.0–46.0)
HEMOGLOBIN: 12.2 g/dL (ref 12.0–15.0)
MCH: 27.7 pg (ref 26.0–34.0)
MCHC: 31.9 g/dL (ref 30.0–36.0)
MCV: 86.8 fL (ref 78.0–100.0)
PLATELETS: 162 10*3/uL (ref 150–400)
RBC: 4.41 MIL/uL (ref 3.87–5.11)
RDW: 14.1 % (ref 11.5–15.5)
WBC: 9 10*3/uL (ref 4.0–10.5)

## 2015-10-21 LAB — GLUCOSE, CAPILLARY
GLUCOSE-CAPILLARY: 108 mg/dL — AB (ref 65–99)
GLUCOSE-CAPILLARY: 127 mg/dL — AB (ref 65–99)
Glucose-Capillary: 110 mg/dL — ABNORMAL HIGH (ref 65–99)
Glucose-Capillary: 157 mg/dL — ABNORMAL HIGH (ref 65–99)

## 2015-10-21 LAB — HEPARIN LEVEL (UNFRACTIONATED): HEPARIN UNFRACTIONATED: 0.61 [IU]/mL (ref 0.30–0.70)

## 2015-10-21 LAB — APTT: APTT: 79 s — AB (ref 24–37)

## 2015-10-21 MED ORDER — DEXTROSE 5 % IV SOLN
3.0000 g | INTRAVENOUS | Status: AC
  Administered 2015-10-22: 3 g via INTRAVENOUS
  Filled 2015-10-21 (×2): qty 3000

## 2015-10-21 MED ORDER — SODIUM CHLORIDE 0.9 % IV SOLN: INTRAVENOUS | Status: DC

## 2015-10-21 MED ORDER — CHLORHEXIDINE GLUCONATE 4 % EX LIQD
60.0000 mL | Freq: Once | CUTANEOUS | Status: AC
  Administered 2015-10-22: 4 via TOPICAL
  Filled 2015-10-21: qty 60

## 2015-10-21 NOTE — Progress Notes (Addendum)
Occupational Therapy Treatment Patient Details Name: Marisa JohnsSheryl Demchak MRN: [id number] DOB: 09/28/1945 Today's Date: 10/21/2015    History of present illness Pt is a 70 y.o. female now s/p fall with resulting Rt ankle fracture and now s/p external fixator application. PMH: hypertension, atrial fibrillation.    OT comments  Patient making good progress towards OT goals, continue plan of care for now. Pt motivated and eager to work with therapist. Pt required +2 for safety due to NWB and complaints of dizziness during session. Dizziness seemed to come and go during transitional movements (i.e. During peri care in sitting -head turning to side). Plan is for surgery tomorrow, will continue to follow-up for OT treatments and to update follow-up recommendations prn.   Educated pt on theraband HEP (level 2). Shoulder abduction/adduction (shoulder height and above head), Shoulder horizontal abduction/adduction, tricep extension. Encouraged 10 repetitions throughout the day. Encouraged pt to stay seated up in recliner for ~2 hours or as tolerated. Pt happy to be in recliner and out of bed.    Follow Up Recommendations  Supervision/Assistance - 24 hour;CIR    Equipment Recommendations  Other (comment) (TBD)    Recommendations for Other Services   Possible vestibular evaluation?   Precautions / Restrictions Precautions Precautions: Fall Precaution Comments: external fixator  Restrictions Weight Bearing Restrictions: Yes RLE Weight Bearing: Non weight bearing    Mobility Bed Mobility Overal bed mobility: Needs Assistance Bed Mobility: Supine to Sit     Supine to sit: Min assist     General bed mobility comments: Assistance for hip/trunk control for sitting EOB, therapist assisting by using bed pad to bring hips forward. HOB slightly elevated and pt with mod use of bed rails   Transfers Overall transfer level: Needs assistance Equipment used: Rolling walker (2 wheeled) Transfers: Sit to/from  UG[employer] CorporationStand;Stand Pivot Transfers Sit to Stand: Min assist;+2 safety/equipment Stand pivot transfers: Min assist;+2 safety/equipment       General transfer comment: Cues for safety with RW - hand placement, sequencing, technique. Pt did well maintaining NWB to RLE. Pt with +dizziness, but this subsided quickly. ?vestibular due to dizziness onset by various transitional movements and quickly resolved when pt static.     Balance Overall balance assessment: Needs assistance Sitting-balance support: Single extremity supported;Feet supported Sitting balance-Leahy Scale: Good     Standing balance support: Bilateral upper extremity supported;During functional activity Standing balance-Leahy Scale: Poor   ADL Overall ADL's : Needs assistance/impaired Eating/Feeding: Set up;Sitting   Grooming: Set up;Sitting   Upper Body Bathing: Set up;Sitting   Lower Body Bathing: Maximal assistance;Sit to/from stand   Upper Body Dressing : Set up;Sitting   Lower Body Dressing: Total assistance;Sit to/from stand   Toilet Transfer: +2 for physical assistance;Stand-pivot;BSC;RW;Minimal assistance   Toileting- Clothing Manipulation and Hygiene: Total assistance;Sit to/from Nurse, children'sstand     Tub/Shower Transfer Details (indicate cue type and reason): n/a at this time Functional mobility during ADLs: Minimal assistance;Cueing for safety;+2 for safety/equipment General ADL Comments: Pt did well maintaining NWB to RLE. Pt required cueing for safety with RW during ADL. Min/Mod dizziness during session.            Cognition   Behavior During Therapy: WFL for tasks assessed/performed Overall Cognitive Status: Within Functional Limits for tasks assessed                  Pertinent Vitals/ Pain       Pain Assessment: Faces Faces Pain Scale: Hurts a little bit Pain Location: RLE during movement Pain  Descriptors / Indicators: Guarding;Aching Pain Intervention(s): Monitored during  session;Repositioned   Frequency Min 2X/week     Progress Toward Goals  OT Goals(current goals can now befound in the care plan section)  Progress towards OT goals: Progressing toward goals  Acute Rehab OT Goals Patient Stated Goal: to regain independence  OT Goal Formulation: With patient Time For Goal Achievement: 11/02/15 Potential to Achieve Goals: Good  Plan Discharge plan remains appropriate    End of Session Equipment Utilized During Treatment: Rolling walker   Activity Tolerance Patient tolerated treatment well   Patient Left in chair;with call bell/phone within reach    Time: 1007-1032 OT Time Calculation (min): 25 min  Charges: OT General Charges $OT Visit: 1 Procedure OT Treatments $Self Care/Home Management : 8-22 mins  Co-treat with PT for patient & therapist safety OT focused on ADL and functional mobility   Edwin Cap , MS, OTR/L, CLT Pager: [phone]  10/21/2015, 10:53 AM

## 2015-10-21 NOTE — Progress Notes (Signed)
Physical Therapy Treatment Patient Details Name: Marisa Tran MRN: [id number] DOB: 23-Jul-1945 Today's Date: 10/21/2015    History of Present Illness Pt is a 70 y.o. female now s/p fall with resulting Rt ankle fracture and now s/p external fixator application. PMH: hypertension, atrial fibrillation.     PT Comments    Pt performed transfer training from bed to [facility] and BSC to recliner chair during co-treat with OT.  Pt required visual demonstration for technique with good ability to maintain and follow weight bearing restriction.  Pt remains unsteady during transfers and unable to advance to gait training due to weakness in B UEs and + dizziness.  Pt remains to require ST rehab to improve strength to promote functional independence before return home.    Follow Up Recommendations  SNF;Supervision for mobility/OOB     Equipment Recommendations  None recommended by PT (TBA @ next venue.  )    Recommendations for Other Services       Precautions / Restrictions Precautions Precautions: Fall Precaution Comments: external fixator  Restrictions Weight Bearing Restrictions: Yes RLE Weight Bearing: Non weight bearing    Mobility  Bed Mobility               General bed mobility comments: Pt edge of bed with OT on arrival.    Transfers Overall transfer level: Needs assistance Equipment used: Rolling walker (2 wheeled) Transfers: Sit to/from Stand Sit to Stand: Min assist;+2 physical assistance;+2 safety/equipment Stand pivot transfers: +2 safety/equipment;Min assist       General transfer comment: Cues for safety with RW - hand placement, sequencing, technique. Pt did well maintaining NWB to RLE. Pt with +dizziness, but this subsided quickly. ?vestibular due to dizziness onset by various transitional movements and quickly resolved when pt static.   Pt required cues and demonstration for pivot on LLE to [facility] and from [facility] to recliner chair.    Ambulation/Gait Ambulation/Gait  assistance:  (Pt only able to pivot of LLE, reports + dizziness and unable to step to with NWB.  ARMs not strong enough to advance.  )               Stairs            Wheelchair Mobility    Modified Rankin (Stroke Patients Only)       Balance Overall balance assessment: Needs assistance Sitting-balance support: Single extremity supported Sitting balance-Leahy Scale: Good       Standing balance-Leahy Scale: Poor                      Cognition Arousal/Alertness: Awake/alert Behavior During Therapy: WFL for tasks assessed/performed Overall Cognitive Status: Within Functional Limits for tasks assessed                      Exercises General Exercises - Upper Extremity Shoulder ABduction: Strengthening;Both;10 reps;Seated;Theraband Theraband Level (Shoulder Abduction): Level 2 (Red) Shoulder ADduction: Strengthening;Both;10 reps;Seated;Theraband Theraband Level (Shoulder Adduction): Level 2 (Red) Shoulder Horizontal ABduction: Strengthening;Both;10 reps;Seated;Theraband Theraband Level (Shoulder Horizontal Abduction): Level 2 (Red) Shoulder Horizontal ADduction: Strengthening;Both;10 reps;Seated;Theraband Theraband Level (Shoulder Horizontal Adduction): Level 2 (Red) Elbow Extension: Strengthening;Both;10 reps;Seated;Theraband Theraband Level (Elbow Extension): Level 2 (Red)    General Comments        Pertinent Vitals/Pain Pain Assessment: Faces Pain Score: 2  Pain Location: RLE during dependent position.   Pain Descriptors / Indicators: Guarding;Aching Pain Intervention(s): Monitored during session;Repositioned    Home Living  Prior Function            PT Goals (current goals can now be found in the care plan section) Acute Rehab PT Goals Patient Stated Goal: to regain independence  Potential to Achieve Goals: Good Progress towards PT goals: Progressing toward goals    Frequency  Min 2X/week    PT  Plan      Co-evaluation PT/OT/SLP Co-Evaluation/Treatment: Yes Reason for Co-Treatment: For patient/therapist safety PT goals addressed during session: Mobility/safety with mobility       End of Session Equipment Utilized During Treatment: Gait belt Activity Tolerance: Other (comment) (limited due to dizziness, post tx pt performing UE resistance exercises to improve arm strength.  ) Patient left: in chair;with chair alarm set;with call bell/phone within reach     Time: 1012 (OT in pre tx and post tx to perform OT tx.  )-1026 PT Time Calculation (min) (ACUTE ONLY): 14 min  Charges:  $Therapeutic Activity: 8-22 mins                    G Codes:      Florestine Aversimee J Abbagail Scaff 10/21/2015, 2:52 PM Joycelyn RuaAimee Burlin Mcnair, PTA pager [phone]

## 2015-10-21 NOTE — Progress Notes (Signed)
Physical medicine rehabilitation consult requested chart reviewed. Currently with plan for ORIF right ankle fracture 10/22/2015. Will follow-up with formal rehabilitation consult after procedure completed.

## 2015-10-21 NOTE — Progress Notes (Addendum)
Subjective: 4 Days Post-Op Procedure(s) (LRB): CLOSED REDUCTION ANKLE (Right) EXTERNAL FIXATION RIGHT ANKLE (Right)  Patient reports pain as mild to moderate.  Resting comfortably in bed this am.  Tolerating POs well.  Admits to BM.  Denies fever, chills, N/V.  Objective:   VITALS:  Temp:  [98.5 F (36.9 C)-98.7 F (37.1 C)] 98.6 F (37 C) (04/24 0435) Pulse Rate:  [73-82] 73 (04/24 0435) Resp:  [18-19] 18 (04/24 0435) BP: (120-127)/(57-71) 123/71 mmHg (04/24 0435) SpO2:  [94 %-100 %] 100 % (04/24 0435) Weight:  [151.002 kg (332 lb 14.4 oz)] 151.002 kg (332 lb 14.4 oz) (04/24 0500)  General: WDWN patient in NAD. Psych:  Appropriate mood and affect. Neuro:  A&O x 3, Moving all extremities, sensation intact to light touch HEENT:  EOMs intact Chest:  Even non-labored respirations Skin:  Dressing/splint C/D/I, no rashes or lesions Extremities: warm/dry, no visible edema, erythema, or echymosis.  No lymphadenopathy. Pulses: Popliteus 2+ MSK:  ROM: EHL/FHL intact, MMT: patient able to perform quad set    LABS  Recent Labs  10/19/15 0542 10/20/15 0238 10/21/15 0526  HGB 13.0 12.6 12.2  WBC 10.8* 10.5 9.0  PLT 165 181 162   No results for input(s): NA, K, CL, CO2, BUN, CREATININE, GLUCOSE in the last 72 hours. No results for input(s): LABPT, INR in the last 72 hours.   Assessment/Plan: 4 Days Post-Op Procedure(s) (LRB): CLOSED REDUCTION ANKLE (Right) EXTERNAL FIXATION RIGHT ANKLE (Right)  Up with therapy  NWB RLE Heparin for DVT prophylaxis, HOLD 12 hours prior to surgery.  Surgery at 1500 on 10/22/15. NPO after midnight Plan for ORIF R ankle fracture tomorrow afternoon by Dr. Raylene EvertsHewitt Spent greater than 10 minutes discussing in detail surgical and post-op protocol with family  Alfredo MartinezJustin Elfie Costanza, Cordelia PochePA-C, ATC [facility] Office:  [phone]

## 2015-10-21 NOTE — Progress Notes (Signed)
ANTICOAGULATION CONSULT NOTE  Pharmacy Consult for Heparin Indication: atrial fibrillation  Allergies  Allergen Reactions  . Iodine Anaphylaxis    Patient Measurements: Height: 5\' 5"  (165.1 cm) Weight: (!) 332 lb 14.4 oz (151.002 kg) IBW/kg (Calculated) : 57 Heparin Dosing Weight: 100 kg  Vital Signs: Temp: 98.6 F (37 C) (04/24 0435) Temp Source: Oral (04/23 2204) BP: 123/71 mmHg (04/24 0435) Pulse Rate: 73 (04/24 0435)  Labs:  Recent Labs  10/19/15 0542 10/19/15 1301 10/20/15 0238 10/21/15 0526  HGB 13.0  --  12.6 12.2  HCT 41.9  --  40.1 38.3  PLT 165  --  181 162  APTT 85* 85* 85* 79*  HEPARINUNFRC 0.97*  --  0.74* 0.61    Estimated Creatinine Clearance: 75.2 mL/min (by C-G formula based on Cr of 1.04).  Assessment: 70 year old female on apixaban PTA for Afib (last dose 4/19) - transitioned to heparin bridge for ankle surgery tentatively planned for Tuesday. Pt remains therapeutic on 1500 units/hr. HL and aPTT appear to be correlating, will discontinue aPTTs.  Goal of Therapy:  Heparin level 0.3-0.7 units/ml Monitor platelets by anticoagulation protocol: Yes     Plan:  Continue heparin at 1500 units / hr Daily heparin level     Agapito GamesAlison Sophira Rumler, PharmD, BCPS Clinical Pharmacist 10/21/2015 7:20 AM

## 2015-10-21 NOTE — Clinical Social Work Note (Signed)
CSW called patient's daughter, Baldwin JamaicaDory, regarding patient's discharge disposition. Patient's daughter did not answer, CSW left message requesting return call. CSW will follow-up with patient's daughter on 10/22/2015.  Marcelline Deistmily Jenaveve Fenstermaker, LCSW [phone] Orthopedics: [phone] Surgical: [phone]

## 2015-10-22 ENCOUNTER — Inpatient Hospital Stay (HOSPITAL_COMMUNITY): Payer: Medicare (Managed Care) | Admitting: Certified Registered Nurse Anesthetist

## 2015-10-22 ENCOUNTER — Inpatient Hospital Stay (HOSPITAL_COMMUNITY)
Admission: EM | Disposition: A | Discharge status: Skilled Nursing Facility | Payer: Medicare (Managed Care) | Source: Home / Self Care | Attending: Orthopedic Surgery

## 2015-10-22 ENCOUNTER — Encounter (HOSPITAL_COMMUNITY): Payer: Self-pay | Admitting: Certified Registered Nurse Anesthetist

## 2015-10-22 HISTORY — PX: ORIF ANKLE FRACTURE: SHX5408

## 2015-10-22 HISTORY — PX: EXTERNAL FIXATION REMOVAL: SHX5040

## 2015-10-22 LAB — GLUCOSE, CAPILLARY
GLUCOSE-CAPILLARY: 133 mg/dL — AB (ref 65–99)
GLUCOSE-CAPILLARY: 198 mg/dL — AB (ref 65–99)
Glucose-Capillary: 113 mg/dL — ABNORMAL HIGH (ref 65–99)
Glucose-Capillary: 119 mg/dL — ABNORMAL HIGH (ref 65–99)
Glucose-Capillary: 122 mg/dL — ABNORMAL HIGH (ref 65–99)

## 2015-10-22 LAB — CBC
HCT: 39 % (ref 36.0–46.0)
HEMOGLOBIN: 12.5 g/dL (ref 12.0–15.0)
MCH: 27.5 pg (ref 26.0–34.0)
MCHC: 32.1 g/dL (ref 30.0–36.0)
MCV: 85.7 fL (ref 78.0–100.0)
PLATELETS: 184 10*3/uL (ref 150–400)
RBC: 4.55 MIL/uL (ref 3.87–5.11)
RDW: 14.4 % (ref 11.5–15.5)
WBC: 7.9 10*3/uL (ref 4.0–10.5)

## 2015-10-22 LAB — HEPARIN LEVEL (UNFRACTIONATED): HEPARIN UNFRACTIONATED: 0.15 [IU]/mL — AB (ref 0.30–0.70)

## 2015-10-22 LAB — APTT: APTT: 34 s (ref 24–37)

## 2015-10-22 SURGERY — OPEN REDUCTION INTERNAL FIXATION (ORIF) ANKLE FRACTURE
Anesthesia: General | Site: Ankle | Laterality: Right

## 2015-10-22 MED ORDER — FENTANYL CITRATE (PF) 100 MCG/2ML IJ SOLN
25.0000 ug | INTRAMUSCULAR | Status: DC | PRN
  Administered 2015-10-22 (×2): 25 ug via INTRAVENOUS
  Administered 2015-10-22: 50 ug via INTRAVENOUS
  Administered 2015-10-22: 25 ug via INTRAVENOUS

## 2015-10-22 MED ORDER — ONDANSETRON HCL 4 MG/2ML IJ SOLN
INTRAMUSCULAR | Status: DC | PRN
  Administered 2015-10-22: 4 mg via INTRAVENOUS

## 2015-10-22 MED ORDER — DEXMEDETOMIDINE HCL IN NACL 200 MCG/50ML IV SOLN
INTRAVENOUS | Status: AC
  Filled 2015-10-22: qty 50

## 2015-10-22 MED ORDER — VECURONIUM BROMIDE 10 MG IV SOLR
INTRAVENOUS | Status: AC
  Filled 2015-10-22: qty 10

## 2015-10-22 MED ORDER — BUPIVACAINE-EPINEPHRINE (PF) 0.5% -1:200000 IJ SOLN
INTRAMUSCULAR | Status: AC
  Filled 2015-10-22: qty 30

## 2015-10-22 MED ORDER — SUCCINYLCHOLINE CHLORIDE 20 MG/ML IJ SOLN
INTRAMUSCULAR | Status: DC | PRN
  Administered 2015-10-22: 120 mg via INTRAVENOUS

## 2015-10-22 MED ORDER — VANCOMYCIN HCL 500 MG IV SOLR
INTRAVENOUS | Status: AC
  Filled 2015-10-22: qty 500

## 2015-10-22 MED ORDER — ONDANSETRON HCL 4 MG/2ML IJ SOLN
INTRAMUSCULAR | Status: AC
  Filled 2015-10-22: qty 6

## 2015-10-22 MED ORDER — LIDOCAINE HCL (CARDIAC) 20 MG/ML IV SOLN
INTRAVENOUS | Status: AC
Start: 2015-10-22 — End: 2015-10-22
  Filled 2015-10-22: qty 15

## 2015-10-22 MED ORDER — 0.9 % SODIUM CHLORIDE (POUR BTL) OPTIME
TOPICAL | Status: DC | PRN
  Administered 2015-10-22: 1000 mL

## 2015-10-22 MED ORDER — PROPOFOL 10 MG/ML IV BOLUS
INTRAVENOUS | Status: DC | PRN
  Administered 2015-10-22: 200 mg via INTRAVENOUS

## 2015-10-22 MED ORDER — DEXAMETHASONE SODIUM PHOSPHATE 4 MG/ML IJ SOLN
INTRAMUSCULAR | Status: DC | PRN
  Administered 2015-10-22: 4 mg via INTRAVENOUS

## 2015-10-22 MED ORDER — FENTANYL CITRATE (PF) 250 MCG/5ML IJ SOLN
INTRAMUSCULAR | Status: DC | PRN
  Administered 2015-10-22: 50 ug via INTRAVENOUS

## 2015-10-22 MED ORDER — PROPOFOL 10 MG/ML IV BOLUS
INTRAVENOUS | Status: AC
  Filled 2015-10-22: qty 20

## 2015-10-22 MED ORDER — PHENYLEPHRINE 40 MCG/ML (10ML) SYRINGE FOR IV PUSH (FOR BLOOD PRESSURE SUPPORT)
PREFILLED_SYRINGE | INTRAVENOUS | Status: AC
  Filled 2015-10-22: qty 10

## 2015-10-22 MED ORDER — DEXMEDETOMIDINE HCL IN NACL 200 MCG/50ML IV SOLN
INTRAVENOUS | Status: DC | PRN
  Administered 2015-10-22 (×4): 8 ug via INTRAVENOUS

## 2015-10-22 MED ORDER — HYDROMORPHONE HCL 1 MG/ML IJ SOLN
1.0000 mg | INTRAMUSCULAR | Status: DC | PRN
  Administered 2015-10-22 – 2015-10-23 (×3): 1 mg via INTRAVENOUS
  Filled 2015-10-22 (×3): qty 1

## 2015-10-22 MED ORDER — PHENYLEPHRINE HCL 10 MG/ML IJ SOLN
INTRAMUSCULAR | Status: DC | PRN
  Administered 2015-10-22: 80 ug via INTRAVENOUS

## 2015-10-22 MED ORDER — OXYCODONE HCL 5 MG PO TABS
5.0000 mg | ORAL_TABLET | ORAL | Status: DC | PRN
  Administered 2015-10-22 – 2015-10-25 (×14): 10 mg via ORAL
  Filled 2015-10-22 (×14): qty 2

## 2015-10-22 MED ORDER — APIXABAN 5 MG PO TABS
5.0000 mg | ORAL_TABLET | Freq: Two times a day (BID) | ORAL | Status: DC
  Administered 2015-10-22 – 2015-10-25 (×6): 5 mg via ORAL
  Filled 2015-10-22 (×6): qty 1

## 2015-10-22 MED ORDER — SENNA 8.6 MG PO TABS
1.0000 | ORAL_TABLET | Freq: Two times a day (BID) | ORAL | Status: DC
  Administered 2015-10-23 – 2015-10-25 (×5): 8.6 mg via ORAL
  Filled 2015-10-22 (×6): qty 1

## 2015-10-22 MED ORDER — SODIUM CHLORIDE 0.9 % IV SOLN
INTRAVENOUS | Status: DC
  Administered 2015-10-22: 18:00:00 via INTRAVENOUS

## 2015-10-22 MED ORDER — BUPIVACAINE-EPINEPHRINE 0.5% -1:200000 IJ SOLN
INTRAMUSCULAR | Status: DC | PRN
  Administered 2015-10-22: 15 mL

## 2015-10-22 MED ORDER — STERILE WATER FOR INJECTION IJ SOLN
INTRAMUSCULAR | Status: AC
  Filled 2015-10-22: qty 10

## 2015-10-22 MED ORDER — ROCURONIUM BROMIDE 50 MG/5ML IV SOLN
INTRAVENOUS | Status: AC
Start: 2015-10-22 — End: 2015-10-22
  Filled 2015-10-22: qty 3

## 2015-10-22 MED ORDER — LIDOCAINE HCL (CARDIAC) 20 MG/ML IV SOLN
INTRAVENOUS | Status: DC | PRN
  Administered 2015-10-22: 80 mg via INTRAVENOUS

## 2015-10-22 MED ORDER — ALBUTEROL SULFATE HFA 108 (90 BASE) MCG/ACT IN AERS
INHALATION_SPRAY | RESPIRATORY_TRACT | Status: DC | PRN
  Administered 2015-10-22: 6 via RESPIRATORY_TRACT

## 2015-10-22 MED ORDER — FENTANYL CITRATE (PF) 100 MCG/2ML IJ SOLN
INTRAMUSCULAR | Status: AC
  Filled 2015-10-22: qty 2

## 2015-10-22 MED ORDER — NEOSTIGMINE METHYLSULFATE 10 MG/10ML IV SOLN
INTRAVENOUS | Status: AC
  Filled 2015-10-22: qty 2

## 2015-10-22 MED ORDER — ONDANSETRON HCL 4 MG/2ML IJ SOLN: 4.0000 mg | Freq: Once | INTRAMUSCULAR | Status: DC | PRN

## 2015-10-22 MED ORDER — LACTATED RINGERS IV SOLN
INTRAVENOUS | Status: DC
  Administered 2015-10-22: 15:00:00 via INTRAVENOUS

## 2015-10-22 MED ORDER — VANCOMYCIN HCL 500 MG IV SOLR
INTRAVENOUS | Status: DC | PRN
  Administered 2015-10-22: 500 mg

## 2015-10-22 MED ORDER — ACETAMINOPHEN 10 MG/ML IV SOLN
1000.0000 mg | INTRAVENOUS | Status: AC
  Administered 2015-10-22: 1000 mg via INTRAVENOUS
  Filled 2015-10-22: qty 100

## 2015-10-22 MED ORDER — DOCUSATE SODIUM 100 MG PO CAPS
100.0000 mg | ORAL_CAPSULE | Freq: Two times a day (BID) | ORAL | Status: DC
Start: 2015-10-22 — End: 2015-10-25
  Administered 2015-10-22 – 2015-10-25 (×6): 100 mg via ORAL
  Filled 2015-10-22 (×6): qty 1

## 2015-10-22 MED ORDER — FENTANYL CITRATE (PF) 250 MCG/5ML IJ SOLN
INTRAMUSCULAR | Status: AC
  Filled 2015-10-22: qty 5

## 2015-10-22 SURGICAL SUPPLY — 70 items
ALCOHOL 70% 16 OZ (MISCELLANEOUS) ×3 IMPLANT
BANDAGE ESMARK 6X9 LF (GAUZE/BANDAGES/DRESSINGS) ×1 IMPLANT
BIT DRILL 2.5X2.75 QC CALB (BIT) ×3 IMPLANT
BIT DRILL 2.9 CANN QC NONSTRL (BIT) ×3 IMPLANT
BIT DRILL 3.5X5.5 QC CALB (BIT) ×3 IMPLANT
BLADE SURG 15 STRL LF DISP TIS (BLADE) ×1 IMPLANT
BLADE SURG 15 STRL SS (BLADE) ×2
BNDG COHESIVE 4X5 TAN STRL (GAUZE/BANDAGES/DRESSINGS) ×3 IMPLANT
BNDG COHESIVE 6X5 TAN STRL LF (GAUZE/BANDAGES/DRESSINGS) ×3 IMPLANT
BNDG ESMARK 6X9 LF (GAUZE/BANDAGES/DRESSINGS) ×3
CANISTER SUCT 3000ML PPV (MISCELLANEOUS) ×3 IMPLANT
CHLORAPREP W/TINT 26ML (MISCELLANEOUS) ×3 IMPLANT
COVER SURGICAL LIGHT HANDLE (MISCELLANEOUS) ×3 IMPLANT
CUFF TOURNIQUET SINGLE 24IN (TOURNIQUET CUFF) ×3 IMPLANT
CUFF TOURNIQUET SINGLE 34IN LL (TOURNIQUET CUFF) IMPLANT
CUFF TOURNIQUET SINGLE 44IN (TOURNIQUET CUFF) IMPLANT
DRAPE OEC MINIVIEW 54X84 (DRAPES) ×3 IMPLANT
DRAPE U-SHAPE 47X51 STRL (DRAPES) ×3 IMPLANT
DRSG ADAPTIC 3X8 NADH LF (GAUZE/BANDAGES/DRESSINGS) IMPLANT
DRSG MEPITEL 4X7.2 (GAUZE/BANDAGES/DRESSINGS) ×3 IMPLANT
DRSG PAD ABDOMINAL 8X10 ST (GAUZE/BANDAGES/DRESSINGS) ×6 IMPLANT
ELECT REM PT RETURN 9FT ADLT (ELECTROSURGICAL) ×3
ELECTRODE REM PT RTRN 9FT ADLT (ELECTROSURGICAL) ×1 IMPLANT
GAUZE SPONGE 4X4 12PLY STRL (GAUZE/BANDAGES/DRESSINGS) ×3 IMPLANT
GLOVE BIO SURGEON STRL SZ8 (GLOVE) ×3 IMPLANT
GLOVE BIOGEL PI IND STRL 8 (GLOVE) ×1 IMPLANT
GLOVE BIOGEL PI INDICATOR 8 (GLOVE) ×2
GLOVE ECLIPSE 7.5 STRL STRAW (GLOVE) ×3 IMPLANT
GOWN STRL REUS W/ TWL LRG LVL3 (GOWN DISPOSABLE) ×1 IMPLANT
GOWN STRL REUS W/ TWL XL LVL3 (GOWN DISPOSABLE) ×2 IMPLANT
GOWN STRL REUS W/TWL LRG LVL3 (GOWN DISPOSABLE) ×2
GOWN STRL REUS W/TWL XL LVL3 (GOWN DISPOSABLE) ×4
KIT BASIN OR (CUSTOM PROCEDURE TRAY) ×3 IMPLANT
KIT ROOM TURNOVER OR (KITS) ×3 IMPLANT
NEEDLE 22X1 1/2 (OR ONLY) (NEEDLE) ×3 IMPLANT
NS IRRIG 1000ML POUR BTL (IV SOLUTION) ×3 IMPLANT
PACK ORTHO EXTREMITY (CUSTOM PROCEDURE TRAY) ×3 IMPLANT
PAD ARMBOARD 7.5X6 YLW CONV (MISCELLANEOUS) ×6 IMPLANT
PAD CAST 4YDX4 CTTN HI CHSV (CAST SUPPLIES) ×2 IMPLANT
PADDING CAST COTTON 4X4 STRL (CAST SUPPLIES) ×4
PADDING CAST COTTON 6X4 STRL (CAST SUPPLIES) ×3 IMPLANT
PLATE 6H 3.5 143556 (Plate) ×3 IMPLANT
SCREW ACE CAN 4.0 42M (Screw) ×6 IMPLANT
SCREW ACE CAN 4.0 44M (Screw) ×3 IMPLANT
SCREW CORTICAL 3.5MM  16MM (Screw) ×4 IMPLANT
SCREW CORTICAL 3.5MM  20MM (Screw) ×4 IMPLANT
SCREW CORTICAL 3.5MM  28MM (Screw) ×2 IMPLANT
SCREW CORTICAL 3.5MM 16MM (Screw) ×2 IMPLANT
SCREW CORTICAL 3.5MM 18MM (Screw) ×3 IMPLANT
SCREW CORTICAL 3.5MM 20MM (Screw) ×2 IMPLANT
SCREW CORTICAL 3.5MM 28MM (Screw) ×1 IMPLANT
SLEEVE SURGEON STRL (DRAPES) ×3 IMPLANT
SPONGE LAP 18X18 X RAY DECT (DISPOSABLE) ×3 IMPLANT
STAPLER VISISTAT 35W (STAPLE) IMPLANT
SUCTION FRAZIER HANDLE 10FR (MISCELLANEOUS) ×2
SUCTION TUBE FRAZIER 10FR DISP (MISCELLANEOUS) ×1 IMPLANT
SUT ETHILON 3 0 PS 1 (SUTURE) ×3 IMPLANT
SUT MNCRL AB 3-0 PS2 18 (SUTURE) ×3 IMPLANT
SUT PROLENE 3 0 PS 2 (SUTURE) ×3 IMPLANT
SUT VIC AB 2-0 CT1 27 (SUTURE) ×6
SUT VIC AB 2-0 CT1 TAPERPNT 27 (SUTURE) ×3 IMPLANT
SUT VIC AB 3-0 PS2 18 (SUTURE) ×2
SUT VIC AB 3-0 PS2 18XBRD (SUTURE) ×1 IMPLANT
SYR CONTROL 10ML LL (SYRINGE) ×3 IMPLANT
TOWEL OR 17X24 6PK STRL BLUE (TOWEL DISPOSABLE) ×3 IMPLANT
TOWEL OR 17X26 10 PK STRL BLUE (TOWEL DISPOSABLE) ×3 IMPLANT
TUBE CONNECTING 12'X1/4 (SUCTIONS) ×1
TUBE CONNECTING 12X1/4 (SUCTIONS) ×2 IMPLANT
WATER STERILE IRR 1000ML POUR (IV SOLUTION) ×3 IMPLANT
WIRE K 1.6MM 144256 (MISCELLANEOUS) ×6 IMPLANT

## 2015-10-22 NOTE — Interval H&P Note (Signed)
History and Physical Interval Note:  10/22/2015 7:57 AM  Charie Mcclory  has presented today for surgery, with the diagnosis of right ankle trimalleolar fracture  The various methods of treatment have been discussed with the patient and family. After consideration of risks, benefits and other options for treatment, the patient has consented to  Procedure(s): OPEN REDUCTION INTERNAL FIXATION (ORIF) RIGHT ANKLE TRIMALLEOLAR FRACTURE (Right) REMOVAL EXTERNAL FIXATION  (Right) as a surgical intervention .  The patient's history has been reviewed, patient examined, no change in status, stable for surgery.  I have reviewed the patient's chart and labs.  Questions were answered to the patient's satisfaction.    The risks and benefits of the alternative treatment options have been discussed in detail.  The patient wishes to proceed with surgery and specifically understands risks of bleeding, infection, nerve damage, blood clots, need for additional surgery, amputation and death.  Toni ArthursHEWITT, Sylvi Rybolt

## 2015-10-22 NOTE — Transfer of Care (Signed)
Immediate Anesthesia Transfer of Care Note  Patient: Jenafer Douse  Procedure(s) Performed: Procedure(s): OPEN REDUCTION INTERNAL FIXATION (ORIF) RIGHT ANKLE TRIMALLEOLAR FRACTURE (Right) REMOVAL EXTERNAL FIXATION  (Right)  Patient Location: PACU  Anesthesia Type:General  Level of Consciousness: awake, alert  and oriented  Airway & Oxygen Therapy: Patient Spontanous Breathing and Patient connected to face mask oxygen  Post-op Assessment: Report given to RN, Post -op Vital signs reviewed and stable and Patient moving all extremities X 4  Post vital signs: Reviewed and stable  Last Vitals:  Filed Vitals:   10/22/15 0447 10/22/15 1402  BP: 131/72 138/70  Pulse: 72 62  Temp: 36.4 C 36.8 C  Resp: 20 20    Complications: No apparent anesthesia complications

## 2015-10-22 NOTE — Brief Op Note (Signed)
10/16/2015 - 10/22/2015  4:33 PM  PATIENT:  Marisa Tran  70 y.o. female  PRE-OPERATIVE DIAGNOSIS:  right ankle trimalleolar fracture / dislocation s/p closed reduction and external fixation  POST-OPERATIVE DIAGNOSIS:  same  Procedure(s): 1.  Removal of external fixator from the right ankle under anesthesia 2.  OPEN REDUCTION INTERNAL FIXATION (ORIF) RIGHT ANKLE TRIMALLEOLAR FRACTURE with fixation of the posterior lip 3.  AP, mortise and lateral xrays of the right ankle   SURGEON:  Toni ArthursJohn Nayleen Janosik, MD  ASSISTANT: Alfredo MartinezJustin Ollis, PA-C  ANESTHESIA:   General  EBL:  minimal   TOURNIQUET:   Total Tourniquet Time Documented: Calf (Right) - 50 minutes Total: Calf (Right) - 50 minutes  COMPLICATIONS:  None apparent  DISPOSITION:  Extubated, awake and stable to recovery.  DICTATION ID:  [id number]

## 2015-10-22 NOTE — Progress Notes (Signed)
Physical medicine rehabilitation consult requested chart reviewed. Tentatively patient planned for further ankle surgery today presently with external fixator. Recommendations have been made for skilled nursing facility [place] going to [place] and seeking skilled nursing facility at that time. Patient currently does not have medical necessity for inpatient rehabilitation services needed

## 2015-10-22 NOTE — Anesthesia Preprocedure Evaluation (Addendum)
Anesthesia Evaluation  Patient identified by MRN, date of birth, ID band Patient awake    Reviewed: Allergy & Precautions, NPO status , Patient's Chart, lab work & pertinent test results  History of Anesthesia Complications Negative for: history of anesthetic complications  Airway Mallampati: III  TM Distance: >3 FB Neck ROM: Full    Dental no notable dental hx. (+) Dental Advisory Given   Pulmonary neg pulmonary ROS,    Pulmonary exam normal breath sounds clear to auscultation       Cardiovascular hypertension, Pt. on medications Normal cardiovascular exam+ dysrhythmias Atrial Fibrillation  Rhythm:Regular Rate:Normal     Neuro/Psych negative neurological ROS  negative psych ROS   GI/Hepatic negative GI ROS, Neg liver ROS,   Endo/Other  diabetesMorbid obesity  Renal/GU negative Renal ROS  negative genitourinary   Musculoskeletal negative musculoskeletal ROS (+)   Abdominal   Peds negative pediatric ROS (+)  Hematology negative hematology ROS (+)   Anesthesia Other Findings   Reproductive/Obstetrics negative OB ROS                            Anesthesia Physical Anesthesia Plan  ASA: III  Anesthesia Plan: General   Post-op Pain Management:    Induction: Intravenous  Airway Management Planned: Oral ETT  Additional Equipment:   Intra-op Plan:   Post-operative Plan: Extubation in OR  Informed Consent: I have reviewed the patients History and Physical, chart, labs and discussed the procedure including the risks, benefits and alternatives for the proposed anesthesia with the patient or authorized representative who has indicated his/her understanding and acceptance.   Dental advisory given  Plan Discussed with: CRNA  Anesthesia Plan Comments: (Surgeon reports that he will place local during surgery)       Anesthesia Quick Evaluation

## 2015-10-22 NOTE — Discharge Instructions (Signed)
Toni ArthursJohn Hewitt, MD [facility]  Please read the following information regarding your care after surgery.  Medications  You only need a prescription for the narcotic pain medicine (ex. oxycodone, Percocet, Norco).  All of the other medicines listed below are available over the counter. X acetominophen (Tylenol) 650 mg every 4-6 hours as you need for minor pain X oxycodone as prescribed for moderate to severe pain ?   Narcotic pain medicine (ex. oxycodone, Percocet, Vicodin) will cause constipation.  To prevent this problem, take the following medicines while you are taking any pain medicine. X docusate sodium (Colace) 100 mg twice a day X senna (Senokot) 2 tablets twice a day  ? To help prevent blood clots, take eliquis as you do normally after surgery.  You should also get up every hour while you are awake to move around.    Weight Bearing X Do not bear any weight on the operated leg or foot.  Cast / Splint / Dressing X Keep your splint or cast clean and dry.  Dont put anything (coat hanger, pencil, etc) down inside of it.  If it gets damp, use a hair dryer on the cool setting to dry it.  If it gets soaked, call the office to schedule an appointment for a cast change.   After your dressing, cast or splint is removed; you may shower, but do not soak or scrub the wound.  Allow the water to run over it, and then gently pat it dry.  Swelling It is normal for you to have swelling where you had surgery.  To reduce swelling and pain, keep your toes above your nose for at least 3 days after surgery.  It may be necessary to keep your foot or leg elevated for several weeks.  If it hurts, it should be elevated.  Follow Up Call my office at [phone] when you are discharged from the hospital or surgery center to schedule an appointment to be seen two weeks after surgery.  Call my office at [phone] if you develop a fever >101.5 F, nausea, vomiting, bleeding from the surgical site  or severe pain.

## 2015-10-22 NOTE — NC FL2 (Signed)
[place] MEDICAID FL2 LEVEL OF CARE SCREENING TOOL     IDENTIFICATION  Patient Name: Marisa Tran Birthdate: 08/06/1945 Sex: female Admission Date (Current Location): 10/16/2015  [facility] [place]   Facility and Address:  [facility], [address], [place], [place] [address]      Provider Number: [id number]  Attending Physician Name and Address:  Toni ArthursJohn Hewitt, MD  Relative Name and Phone Number:       Current Level of Care: Hospital Recommended Level of Care: Skilled Nursing Facility Prior Approval Number:    Date Approved/Denied:   PASRR Number:  (Awaiting PASARR)  Discharge Plan: SNF    Current Diagnoses: Patient Active Problem List   Diagnosis Date Noted  . Acute respiratory failure with hypoxia (HCC) 10/18/2015  . Diabetes mellitus, type 2 (HCC) 10/18/2015  . Atrial fibrillation (HCC) 10/18/2015  . Hypertension 10/18/2015  . Fracture dislocation of ankle 10/17/2015  . Fracture of ankle, trimalleolar, right, closed 10/17/2015    Orientation RESPIRATION BLADDER Height & Weight     Self, Time, Situation, Place  Normal Continent Weight: 299 lb 9.6 oz (135.898 kg) Height:  5\' 5"  (165.1 cm)  BEHAVIORAL SYMPTOMS/MOOD NEUROLOGICAL BOWEL NUTRITION STATUS      Continent Diet (Please see discharge summary.)  AMBULATORY STATUS COMMUNICATION OF NEEDS Skin   Extensive Assist Verbally Surgical wounds                       Personal Care Assistance Level of Assistance  Bathing, Feeding, Dressing Bathing Assistance: Limited assistance Feeding assistance: Independent Dressing Assistance: Limited assistance     Functional Limitations Info             SPECIAL CARE FACTORS FREQUENCY  PT (By licensed PT), OT (By licensed OT)     PT Frequency: 5 OT Frequency: 5            Contractures      Additional Factors Info  Code Status, Allergies Code Status Info: FULL Allergies Info: Iodine           Current  Medications (10/22/2015):  This is the current hospital active medication list Current Facility-Administered Medications  Medication Dose Route Frequency Provider Last Rate Last Dose  . 0.9 %  sodium chloride infusion   Intravenous Continuous Toni ArthursJohn Hewitt, MD      . Mitzi Hansen[MAR Hold] acetaminophen (TYLENOL) tablet 650 mg  650 mg Oral Q6H PRN Toni ArthursJohn Hewitt, MD   650 mg at 10/17/15 1801   Or  . [MAR Hold] acetaminophen (TYLENOL) suppository 650 mg  650 mg Rectal Q6H PRN Toni ArthursJohn Hewitt, MD      . Mitzi Hansen[MAR Hold] amLODipine (NORVASC) tablet 2.5 mg  2.5 mg Oral Daily Toni ArthursJohn Hewitt, MD   2.5 mg at 10/22/15 1019  . [MAR Hold] atenolol (TENORMIN) tablet 100 mg  100 mg Oral Daily Toni ArthursJohn Hewitt, MD   100 mg at 10/22/15 1019  . ceFAZolin (ANCEF) 3 g in dextrose 5 % 50 mL IVPB  3 g Intravenous To SS-Surg Toni ArthursJohn Hewitt, MD      . Mitzi Hansen[MAR Hold] citalopram (CELEXA) tablet 20 mg  20 mg Oral Daily Toni ArthursJohn Hewitt, MD   20 mg at 10/22/15 1019  . [MAR Hold] dextromethorphan-guaiFENesin (MUCINEX DM) 30-600 MG per 12 hr tablet 1 tablet  1 tablet Oral BID Toni ArthursJohn Hewitt, MD   1 tablet at 10/22/15 1019  . [MAR Hold] docusate sodium (COLACE) capsule 100 mg  100 mg Oral  BID Toni Arthurs, MD   100 mg at 10/22/15 1018  . [MAR Hold] HYDROmorphone (DILAUDID) injection 1 mg  1 mg Intravenous Q2H PRN Toni Arthurs, MD   1 mg at 10/20/15 2023  . [MAR Hold] insulin aspart (novoLOG) injection 0-9 Units  0-9 Units Subcutaneous TID WC Meredith Pel, NP   2 Units at 10/21/15 1858  . [MAR Hold] metoCLOPramide (REGLAN) tablet 5-10 mg  5-10 mg Oral Q8H PRN Toni Arthurs, MD       Or  . Mitzi Hansen Hold] metoCLOPramide (REGLAN) injection 5-10 mg  5-10 mg Intravenous Q8H PRN Toni Arthurs, MD      . Mitzi Hansen Hold] ondansetron [facility]) tablet 4 mg  4 mg Oral Q6H PRN Toni Arthurs, MD       Or  . Mitzi Hansen Hold] ondansetron [facility]) injection 4 mg  4 mg Intravenous Q6H PRN Toni Arthurs, MD      . Mitzi Hansen Hold] oxyCODONE (Oxy IR/ROXICODONE) immediate release tablet 5-10 mg  5-10 mg Oral Q3H PRN Toni Arthurs, MD   10 mg at 10/22/15 1025  . [MAR Hold] pantoprazole (PROTONIX) EC tablet 40 mg  40 mg Oral Daily Toni Arthurs, MD   40 mg at 10/22/15 1019  . [MAR Hold] senna (SENOKOT) tablet 8.6 mg  1 tablet Oral BID Toni Arthurs, MD   8.6 mg at 10/22/15 1019  . [MAR Hold] triamterene-hydrochlorothiazide (MAXZIDE) 75-50 MG per tablet 1 tablet  1 tablet Oral Daily Toni Arthurs, MD   1 tablet at 10/22/15 1019     Discharge Medications: Please see discharge summary for a list of discharge medications.  Relevant Imaging Results:  Relevant Lab Results:   Additional Information SSN: [id number]  Rod Mae, LCSW    Joana Reamer, ATC [facility] Office:  [phone]

## 2015-10-22 NOTE — Progress Notes (Signed)
Pt returned from pacu this evening. Alert, oriented and responsive verbally.

## 2015-10-22 NOTE — Op Note (Signed)
NAMMalvin Johns:  [place], [place]               ACCOUNT NO.:  [id number]  MEDICAL RECORD NO.:  [id number]  LOCATION:  6N14C                        FACILITY:  [facility]  PHYSICIAN:  Toni ArthursJohn Kemontae Dunklee, MD        DATE OF BIRTH:  1945/12/09  DATE OF PROCEDURE:  10/22/2015 DATE OF DISCHARGE:                              OPERATIVE REPORT   PREOPERATIVE DIAGNOSIS:  Right ankle trimalleolar fracture dislocation status post closed reduction and external fixation.  POSTOPERATIVE DIAGNOSIS:  Right ankle trimalleolar fracture dislocation status post closed reduction and external fixation.  PROCEDURE: 1. Removal of external fixator from the right lower extremity under     anesthesia. 2. Open reduction and internal fixation of the right ankle     trimalleolar fracture with fixation of the posterior lip. 3. AP, mortise, and lateral radiographs of the right ankle.  SURGEON:  Toni ArthursJohn Andre Swander, MD.  ASSISTANT:  Alfredo MartinezJustin Ollis, PA-C.  ANESTHESIA:  General.  ESTIMATED BLOOD LOSS:  Minimal.  TOURNIQUET TIME:  50 minutes at 200 mmHg.  COMPLICATIONS:  None apparent.  DISPOSITION:  Extubated, awake, and stable to recovery.  INDICATION FOR PROCEDURE:  The patient is a 70 year old female with past medical history significant for atrial fibrillation and obesity.  [place] when she fell injuring her right ankle.  She was seen in the emergency department, where radiographs showed a right ankle fracture dislocation.  She was taken to the operating room, where she underwent closed reduction and application of an external fixator.  She presents now for a planned return to the operating room for removal of the external fixator and open reduction and internal fixation of the trimalleolar ankle fracture.  She understands the risks and benefits, the alternative treatment options, and elects surgical treatment.  She specifically understands risks of bleeding, infection, nerve damage, blood  clots, need for additional surgery, continued pain, posttraumatic arthritis, amputation, and death.  PROCEDURE IN DETAIL:  After preoperative consent was obtained and the correct operative site was identified, the patient was brought to the operating room and placed supine on the operating table.  General anesthesia was induced.  Preoperative antibiotics were administered. Surgical time-out was taken.  The right lower extremity splint was removed.  The external fixator bars were loosened and removed.  The pin- to-bar clamps were all removed.  A ball cutter was used to cut the transfixion pin adjacent to the skin of the calcaneus.  The remaining area of metal was cleaned with alcohol and the transfixion pin was removed from the other side of the heel.  The 2 tibial pins were removed in their entirety.  The right lower extremity was then prepped and draped in standard sterile fashion.  A sterile calf tourniquet was applied.  Foot was exsanguinated and the calf tourniquet was inflated to 200 mmHg.  A longitudinal incision was then made over the lateral malleolus.  Sharp dissection was carried down through skin and subcutaneous tissue.  The fracture site was identified.  It was irrigated copiously and cleaned of all hematoma.  The fracture was reduced and held with a tenaculum.  A 3.5-mm fully-threaded lag screw was inserted from posterior to anterior  across the fracture site.  It was noted to have excellent purchase.  A 6-hole 1/3rd tubular plate from the [employer] small frag set was then applied laterally after contouring the plate to fit appropriately.  It was secured proximally with 3 bicortical screws and distally with 2 unicortical screws.  AP and lateral radiographs confirmed appropriate reduction of the lateral malleolus fracture.  A K-wire was then placed from anterior to posterior across the posterior malleolus fracture site.  AP and lateral radiographs confirmed appropriate position  of the K-wire.  It was over drilled and a 4-mm partially threaded cannulated screw was inserted.  It was noted to have excellent purchase and secured the posterior malleolus fragment appropriately.  Attention was then turned to the medial malleolus, where a longitudinal incision was made.  Sharp dissection was carried down through skin and subcutaneous tissue.  The fracture site was identified.  It was irrigated and cleaned of all hematoma.  The medial talar dome was inspected and appeared to be healthy and intact with no evidence of osteochondral lesion.  The medial malleolus fracture was reduced.  It was provisionally held with a tenaculum.  AP and lateral radiographs confirmed appropriate reduction.  Two K-wires were then inserted across the fracture site.  A 42 mm x 4 mm partially-threaded cannulated screws were inserted and both were noted to have excellent purchase.  The K- wires were removed.  Final AP, mortise, and lateral radiographs confirmed appropriate position and length of all hardware and appropriate reduction of all 3 fracture sites.  The wounds were then all irrigated copiously.  Deep subcutaneous tissues were approximated with inverted simple sutures of 2-0 Vicryl.  The superficial subcutaneous tissues were approximated with inverted simple sutures of 3-0 Monocryl. Skin incisions were closed with running 3-0 nylon and Prolene.  Sterile dressings were applied followed by a well-padded short-leg splint. Surgical sites were infiltrated with 0.5% Marcaine with epinephrine after closure to help with postoperative pain control.  The patient was then awakened from anesthesia and transported to the recovery room in stable condition.  FOLLOWUP PLAN:  The patient will be nonweightbearing on the right lower extremity.  She will be discharged to skilled nursing facility when she is medically stable.  Optimal care in the postoperative period would have her staying here in the  local area for close followup.  I believe this is medically necessary given her complex ankle fracture as well as her complicated medical problems.  Alfredo Martinez, PA-C, was present and scrubbed for the duration of the case.  His assistance was essential in positioning the patient, prepping and draping, gaining and maintaining exposure, performing the operation, closing and dressing the wounds, and applying the splint.  RADIOGRAPHS:  AP, mortise, and lateral radiographs of the right ankle were obtained intraoperatively.  These show interval removal of the external fixator as well as reduction and fixation of the trimalleolar fracture.  Hardwares all appropriately positioned and of the appropriate lengths.  The fractures are reduced appropriately.  No other acute injury was noted.     Toni Arthurs, MD     JH/MEDQ  D:  10/22/2015  T:  10/22/2015  Job:  [id number]

## 2015-10-22 NOTE — Anesthesia Procedure Notes (Signed)
Procedure Name: Intubation Date/Time: 10/22/2015 3:12 PM Performed by: Reine JustFLOWERS, Brenner Visconti T Pre-anesthesia Checklist: Patient identified, Emergency Drugs available, Suction available, Patient being monitored and Timeout performed Patient Re-evaluated:Patient Re-evaluated prior to inductionOxygen Delivery Method: Circle system utilized and Simple face mask Preoxygenation: Pre-oxygenation with 100% oxygen Intubation Type: IV induction Ventilation: Mask ventilation without difficulty Laryngoscope Size: Miller and 2 Grade View: Grade II Tube type: Oral Tube size: 7.5 mm Number of attempts: 1 Airway Equipment and Method: Patient positioned with wedge pillow and Stylet Placement Confirmation: ETT inserted through vocal cords under direct vision,  positive ETCO2 and breath sounds checked- equal and bilateral Secured at: 22 cm Tube secured with: Tape Dental Injury: Teeth and Oropharynx as per pre-operative assessment

## 2015-10-23 LAB — CBC
HEMATOCRIT: 41.4 % (ref 36.0–46.0)
HEMOGLOBIN: 13.2 g/dL (ref 12.0–15.0)
MCH: 27.7 pg (ref 26.0–34.0)
MCHC: 31.9 g/dL (ref 30.0–36.0)
MCV: 87 fL (ref 78.0–100.0)
Platelets: 207 10*3/uL (ref 150–400)
RBC: 4.76 MIL/uL (ref 3.87–5.11)
RDW: 14.7 % (ref 11.5–15.5)
WBC: 8.8 10*3/uL (ref 4.0–10.5)

## 2015-10-23 LAB — GLUCOSE, CAPILLARY
GLUCOSE-CAPILLARY: 148 mg/dL — AB (ref 65–99)
GLUCOSE-CAPILLARY: 108 mg/dL — AB (ref 65–99)
Glucose-Capillary: 113 mg/dL — ABNORMAL HIGH (ref 65–99)
Glucose-Capillary: 139 mg/dL — ABNORMAL HIGH (ref 65–99)

## 2015-10-23 LAB — APTT: APTT: 27 s (ref 24–37)

## 2015-10-23 NOTE — Care Management (Addendum)
Daughter Marisa Tran requesting CM to call Marisa Tran [phone] at [employer] . Marisa Tran states Marisa Tran has not received clinicals , same was sent 10/18/15 at today . Called Marisa Tran left voicemail , await call back.  Spoke with Marisa Tran .  Refaxed all clinicals at Marisa Tran at [phone] second fax number [phone]  Marisa FlurryHeather Kesleigh Morson RN BSN [phone]

## 2015-10-23 NOTE — Progress Notes (Signed)
Subjective: 1 Day Post-Op Procedure(s) (LRB): OPEN REDUCTION INTERNAL FIXATION (ORIF) RIGHT ANKLE TRIMALLEOLAR FRACTURE (Right) REMOVAL EXTERNAL FIXATION  (Right)  Patient reports pain as mild to moderate.  Has not eaten since operative procedure yesterday, but reports that she is very hungry.  Denies BM, however admits to flatulence.  Denies fever, chills, N/V.  Objective:   VITALS:  Temp:  [98 F (36.7 C)-98.5 F (36.9 C)] 98.5 F (36.9 C) (04/26 16100608) Pulse Rate:  [43-98] 96 (04/26 0608) Resp:  [13-20] 19 (04/26 0608) BP: (132-142)/(62-87) 142/85 mmHg (04/26 0608) SpO2:  [92 %-100 %] 96 % (04/26 0608) Weight:  [149.959 kg (330 lb 9.6 oz)] 149.959 kg (330 lb 9.6 oz) (04/26 96040608)  General: WDWN patient in NAD. Psych:  Appropriate mood and affect. Neuro:  A&O x 3, Moving all extremities, sensation intact to light touch HEENT:  EOMs intact Chest:  Even non-labored respirations Skin:  Dressing/splint C/D/I, no rashes or lesions Extremities: warm/dry, mild edema, no erythema, mild echymosis to toes.  No lymphadenopathy. Pulses: Popliteus 2+ MSK:  ROM: EHL/FHL intact, MMT: patient can perform quad set    LABS  Recent Labs  10/21/15 0526 10/22/15 0615 10/23/15 0513  HGB 12.2 12.5 13.2  WBC 9.0 7.9 8.8  PLT 162 184 207   No results for input(s): NA, K, CL, CO2, BUN, CREATININE, GLUCOSE in the last 72 hours. No results for input(s): LABPT, INR in the last 72 hours.   Assessment/Plan: 1 Day Post-Op Procedure(s) (LRB): OPEN REDUCTION INTERNAL FIXATION (ORIF) RIGHT ANKLE TRIMALLEOLAR FRACTURE (Right) REMOVAL EXTERNAL FIXATION  (Right)  Up with therapy  NWB RLE Plan is for D/C to local SNF once bed is available.] Plan for 2 week outpatient post-op visit with Dr. Victorino DikeHewitt Eliquis for DVT prophylaxis  Alfredo MartinezJustin Ollis, PA-C, ATC [facility] Office:  [phone]

## 2015-10-23 NOTE — Anesthesia Postprocedure Evaluation (Signed)
Anesthesia Post Note  Patient: Priseis Roupp  Procedure(s) Performed: Procedure(s) (LRB): OPEN REDUCTION INTERNAL FIXATION (ORIF) RIGHT ANKLE TRIMALLEOLAR FRACTURE (Right) REMOVAL EXTERNAL FIXATION  (Right)  Patient location during evaluation: PACU Anesthesia Type: General Level of consciousness: awake and alert Pain management: pain level controlled Vital Signs Assessment: post-procedure vital signs reviewed and stable Respiratory status: spontaneous breathing, nonlabored ventilation, respiratory function stable and patient connected to nasal cannula oxygen Cardiovascular status: blood pressure returned to baseline and stable Postop Assessment: no signs of nausea or vomiting Anesthetic complications: no     Last Vitals:  Filed Vitals:   10/23/15 1020 10/23/15 1519  BP: 143/72 126/76  Pulse: 85 64  Temp: 36.5 C 37 C  Resp: 17 18    Last Pain:  Filed Vitals:   10/23/15 1929  PainSc: Asleep   Pain Goal: Patients Stated Pain Goal: 4 (10/22/15 1715)               Nakiyah Beverley JENNETTE

## 2015-10-24 ENCOUNTER — Encounter (HOSPITAL_COMMUNITY): Payer: Self-pay | Admitting: Orthopedic Surgery

## 2015-10-24 LAB — GLUCOSE, CAPILLARY
GLUCOSE-CAPILLARY: 108 mg/dL — AB (ref 65–99)
GLUCOSE-CAPILLARY: 105 mg/dL — AB (ref 65–99)
GLUCOSE-CAPILLARY: 122 mg/dL — AB (ref 65–99)
Glucose-Capillary: 106 mg/dL — ABNORMAL HIGH (ref 65–99)

## 2015-10-24 LAB — APTT: APTT: 29 s (ref 24–37)

## 2015-10-24 MED ORDER — INSULIN ASPART 100 UNIT/ML ~~LOC~~ SOLN: 0.0000 [IU] | Freq: Three times a day (TID) | SUBCUTANEOUS | Status: DC

## 2015-10-24 NOTE — Clinical Social Work Note (Signed)
Patient's daughter, Baldwin JamaicaDory, [facility]. CSW continues to await insurance authorization and PASARR completion.   Barrier to discharge: Patient CAN NOT be discharged without completed PASARR.  Marcelline Deistmily Anaily Ashbaugh, LCSW [phone] Orthopedics: [phone] Surgical: [phone]

## 2015-10-24 NOTE — Progress Notes (Signed)
OT Cancellation Note  Patient Details Name: [name] MRN: [id number] DOB: 02/09/1946   Cancelled Treatment:    Reason Eval/Treat Not Completed: Other (comment) (PT in room upon OT trying to see patient). Will check back as able, probably tomorrow.   Edwin CapPatricia Tiernan Millikin , MS, OTR/L, CLT Pager: [phone]  10/24/2015, 10:43 AM

## 2015-10-24 NOTE — Care Management Important Message (Signed)
Important Message  Patient Details  Name: Marisa JohnsSheryl Sheaffer MRN: [id number] Date of Birth: 05/22/1946   Medicare Important Message Given:  Yes    Oralia RudMegan P Armstead Heiland 10/24/2015, 3:12 PM

## 2015-10-24 NOTE — Evaluation (Signed)
Physical Therapy Re-Evaluation Patient Details Name: Marisa Tran MRN: [id number] DOB: 10-Jul-1945 Today's Date: 10/24/2015   History of Present Illness  Pt is a 70 y.o. female s/p fall with resulting Rt ankle fracture with placement of an external fixator. She underwent ORIF of a trimalleolar fracture on 10/22/15. PMH: hypertension, atrial fibrillation.   Clinical Impression  Pt seen for re-evaluation now s/p a the above surgical procedure. Pt patient's mobility remains limited at this time and PT is recommending SNF for further rehabilitation when medically stable. PT to continue to follow acutely. PT and family in agreement.     Follow Up Recommendations SNF;Supervision for mobility/OOB    Equipment Recommendations  None recommended by PT    Recommendations for Other Services       Precautions / Restrictions Precautions Precautions: Fall Restrictions Weight Bearing Restrictions: Yes RLE Weight Bearing: Non weight bearing      Mobility  Bed Mobility Overal bed mobility: Needs Assistance       Supine to sit: Supervision        Transfers Overall transfer level: Needs assistance Equipment used: Rolling walker (2 wheeled) Transfers: Sit to/from [employer] Sit to Stand: Mod assist Stand pivot transfers: Min guard       General transfer comment: Pt able to transfer from bed to [facility] to chair. Consistent with NWB status.   Ambulation/Gait             General Gait Details: not attempted  Stairs            Wheelchair Mobility    Modified Rankin (Stroke Patients Only)       Balance Overall balance assessment: Needs assistance Sitting-balance support: No upper extremity supported Sitting balance-Leahy Scale: Good     Standing balance support: Bilateral upper extremity supported Standing balance-Leahy Scale: Poor Standing balance comment: using rw for support                             Pertinent Vitals/Pain Pain  Assessment: Faces Faces Pain Scale: Hurts little more Pain Location: Rt leg Pain Descriptors / Indicators: Aching Pain Intervention(s): Limited activity within patient's tolerance;Monitored during session    Home Living Family/patient expects to be discharged to:: Skilled nursing facility                      Prior Function Level of Independence: Independent               Hand Dominance        Extremity/Trunk Assessment                 RLE Deficits / Details: Pt able to move toes        Communication   Communication: No difficulties  Cognition Arousal/Alertness: Awake/alert Behavior During Therapy: WFL for tasks assessed/performed Overall Cognitive Status: Within Functional Limits for tasks assessed                      General Comments      Exercises        Assessment/Plan    PT Assessment Patient needs continued PT services  PT Diagnosis Difficulty walking;Generalized weakness;Acute pain   PT Problem List Decreased range of motion;Decreased strength;Decreased activity tolerance;Decreased balance;Decreased mobility  PT Treatment Interventions DME instruction;Gait training;Functional mobility training;Therapeutic exercise;Therapeutic activities;Patient/family education   PT Goals (Current goals can be found in the Care Plan section) Acute Rehab PT Goals Patient  Stated Goal: be able to be independent again PT Goal Formulation: With patient/family Time For Goal Achievement: 11/01/15 Potential to Achieve Goals: Good    Frequency Min 2X/week   Barriers to discharge        Co-evaluation               End of Session Equipment Utilized During Treatment: Gait belt Activity Tolerance: Patient tolerated treatment well Patient left: in chair;with call bell/phone within reach;with family/visitor present (Rt LE elevated) Nurse Communication: Mobility status;Weight bearing status         Time: [id number] PT Time Calculation  (min) (ACUTE ONLY): 15 min   Charges:   PT Evaluation $PT Re-evaluation: 1 Procedure     PT G Codes:        Christiane HaBenjamin J. Jackalynn Art, PT, CSCS Pager [phone] Office [phone]  10/24/2015, 2:55 PM

## 2015-10-24 NOTE — Discharge Summary (Signed)
Physician Discharge Summary  Patient ID: Marisa Tran MRN: [id number] DOB/AGE: 71/71/1947 71 y.o.  Admit date: 10/16/2015 Discharge date: 10/24/2015  Admission Diagnoses:  A fib, type 2 diabetes mellitus, obesity, htn, right ankle fracture dislocation  Discharge Diagnoses:  Active Problems:   Fracture dislocation of ankle   Fracture of ankle, trimalleolar, right, closed   Acute respiratory failure with hypoxia (HCC)   Diabetes mellitus, type 2 (HCC)   Atrial fibrillation (HCC)   Hypertension s/p ORIF right ankle fracture  Discharged Condition: stable  Hospital Course: The patient was admitted on 4/19 and was taken to the OR that morning for closed reduction and external fixation.  Her Eliquus was stopped.  She was taken to the OR on 4/25 for removal of the ex fix and ORIF of the trimal ankle fracture.  Her post op course was unremarkable, and she's discharged to SNF in stable condition.  She'll need additional PT and OT as well as management of her blood sugar by the SNF MD.    Consults: hospitalist  Significant Diagnostic Studies: radiology: X-Ray: R ankle trimal fracture  Treatments: surgery: as above  Discharge Exam: Blood pressure 137/87, pulse 87, temperature 98.6 F (37 C), temperature source Oral, resp. rate 18, height  (1.651 m), weight 144.879 kg (319 lb 6.4 oz), SpO2 94 %. Incision/Wound:dressed and dry.  Splint in place on R LE.  NVI at R foot.   A and O x 4.  Mood and affect normal.  EOMI.  resp unlabored.  Disposition: SNF  Discharge Instructions    Call MD / Call 911    Complete by:  As directed   If you experience chest pain or shortness of breath, CALL 911 and be transported to the hospital emergency room.  If you develope a fever above 101 F, pus (white drainage) or increased drainage or redness at the wound, or calf pain, call your surgeon's office.     Constipation Prevention    Complete by:  As directed   Drink plenty of fluids.  Prune juice may be  helpful.  You may use a stool softener, such as Colace (over the counter) 100 mg twice a day.  Use MiraLax (over the counter) for constipation as needed.     Diet Carb Modified    Complete by:  As directed      Increase activity slowly as tolerated    Complete by:  As directed      Non weight bearing    Complete by:  As directed   Laterality:  right  Extremity:  Lower            Medication List    TAKE these medications        acetaminophen 500 MG tablet  Commonly known as:  TYLENOL  Take 1,000 mg by mouth every 6 (six) hours as needed for mild pain, moderate pain or headache.     amLODipine 2.5 MG tablet  Commonly known as:  NORVASC  Take 2.5 mg by mouth daily.     atenolol 100 MG tablet  Commonly known as:  TENORMIN  Take 100 mg by mouth daily.     citalopram 20 MG tablet  Commonly known as:  CELEXA  Take 20 mg by mouth daily.     dextromethorphan-guaiFENesin 30-600 MG 12hr tablet  Commonly known as:  MUCINEX DM  Take 1 tablet by mouth 2 (two) times daily.     docusate sodium 100 MG capsule  Commonly known as:  COLACE  Take 1 capsule (100 mg total) by mouth 2 (two) times daily. While taking narcotic pain medicine.     ELIQUIS 5 MG Tabs tablet  Generic drug:  apixaban  Take 5 mg by mouth 2 (two) times daily.     ibuprofen 200 MG tablet  Commonly known as:  ADVIL,MOTRIN  Take 400 mg by mouth every 6 (six) hours as needed for headache, mild pain or moderate pain.     insulin aspart 100 UNIT/ML injection  Commonly known as:  novoLOG  Inject 0-9 Units into the skin 3 (three) times daily with meals.     loperamide 2 MG tablet  Commonly known as:  IMODIUM A-D  Take 2 mg by mouth 4 (four) times daily as needed for diarrhea or loose stools.     omeprazole 20 MG capsule  Commonly known as:  PRILOSEC  Take 20 mg by mouth daily.     oxyCODONE 5 MG immediate release tablet  Commonly known as:  ROXICODONE  Take 1-2 tablets (5-10 mg total) by mouth every 4 (four)  hours as needed for moderate pain or severe pain.     senna 8.6 MG Tabs tablet  Commonly known as:  SENOKOT  Take 2 tablets (17.2 mg total) by mouth 2 (two) times daily.     triamterene-hydrochlorothiazide 75-50 MG tablet  Commonly known as:  MAXZIDE  Take 1 tablet by mouth daily.           Follow-up Information    Follow up with Neera Teng, Jonny RuizJOHN, MD. Schedule an appointment as soon as possible for a visit in 2 weeks.   Specialty:  Orthopedic Surgery   Contact information:   [address] [place] [address] [phone]       Signed: Toni ArthursHEWITT, Chastelyn Athens 10/24/2015, 2:06 PM

## 2015-10-24 NOTE — Progress Notes (Signed)
Call from central tele about d/c'ing telemetry from patient. 3 RN's looked at d/c orders on 4/25 for tele. Tele will be taken off patient at this time.

## 2015-10-24 NOTE — Progress Notes (Signed)
Refaxed clinicals to Fannie KneeSue at [employer] insurance [employer]; she has received and has authorized for skilled nursing facility stay.  Authorization # is [id number].  Information given to CSW, who will contact SNF.  Plan dc to SNF today pending MD discharge order/sign 30 day note.    Quintella BatonJulie W. Gail Vendetti, RN, BSN  Trauma/Neuro ICU Case Manager [phone]

## 2015-10-24 NOTE — Clinical Social Work Note (Signed)
Patient does NOT have SNF authorization.  Authorization documented in patient's chart is for patient's hospital authorization, NOT SNF placement.  Still awaiting PASARR completion.  Anticipated discharge date is 10/25/2015 pending PASARR authorized.  Barrier to discharge: Patient can not discharge to SNF without PASARR.  Marcelline Deistmily Rosilyn Coachman, LCSW [phone] Orthopedics: [phone] Surgical: [phone]

## 2015-10-25 LAB — APTT: APTT: 34 s (ref 24–37)

## 2015-10-25 LAB — GLUCOSE, CAPILLARY
GLUCOSE-CAPILLARY: 125 mg/dL — AB (ref 65–99)
Glucose-Capillary: 101 mg/dL — ABNORMAL HIGH (ref 65–99)

## 2015-10-25 NOTE — Clinical Social Work Placement (Signed)
   CLINICAL SOCIAL WORK PLACEMENT  NOTE  Date:  10/25/2015  Patient Details  Name: Marisa Tran MRN: [id number] Date of Birth: 10/02/1945  Clinical Social Work is seeking post-discharge placement for this patient at the Skilled  Nursing Facility level of care (*CSW will initial, date and re-position this form in  chart as items are completed):  Yes   Patient/family provided with [place] Clinical Social Work Department's list of facilities offering this level of care within the geographic area requested by the patient (or if unable, by the patient's family).  Yes   Patient/family informed of their freedom to choose among providers that offer the needed level of care, that participate in Medicare, Medicaid or managed care program needed by the patient, have an available bed and are willing to accept the patient.  Yes   Patient/family informed of 's ownership interest in [facility] and [facility], as well as of the fact that they are under no obligation to receive care at these facilities.  PASRR submitted to [employer] on 10/25/15     PASRR number received on 10/25/15     Existing PASRR number confirmed on       FL2 transmitted to all facilities in geographic area requested by pt/family on 10/25/15     FL2 transmitted to all facilities within larger geographic area on       Patient informed that his/her managed care company has contracts with or will negotiate with certain facilities, including the following:        Yes   Patient/family informed of bed offers received.  [facility]     Physician recommends and patient chooses bed at      [facility] 10[facility].  Patient to be transferred to facility by car     Patient family notified on 10/25/15 of transfer.  Name of family member notified:  Dory     PHYSICIAN       Additional Comment:     _______________________________________________ Rod MaeVaughn, Shirleyann Montero S, LCSW 10/25/2015, 2:26 PM

## 2015-10-25 NOTE — Progress Notes (Signed)
[facility], transported by family, report was given to nurse Logan BoresEvans.

## 2015-10-25 NOTE — Clinical Social Work Note (Signed)
Patient approved for admission to SNF [facility] RN staff updated regarding discharge. Patient's daughter to transport via car. RN report number: [phone]  Marisa Deistmily Jeanita Carneiro, LCSW [phone] Orthopedics: [phone] Surgical: [phone]

## 2015-10-25 NOTE — Progress Notes (Signed)
Subjective: 3 Days Post-Op Procedure(s) (LRB): OPEN REDUCTION INTERNAL FIXATION (ORIF) RIGHT ANKLE TRIMALLEOLAR FRACTURE (Right) REMOVAL EXTERNAL FIXATION  (Right)  Patient reports pain as mild to moderate.  Denies fever, chills, N/V.  Tolerating POs well.  Admits to BM.  Reports that she is ready for SNF placement.  Objective:   VITALS:  Temp:  [97.7 F (36.5 C)-98.6 F (37 C)] 97.7 F (36.5 C) (04/28 0514) Pulse Rate:  [71-87] 81 (04/28 0514) Resp:  [16-19] 19 (04/28 0514) BP: (137-158)/(65-87) 151/66 mmHg (04/28 0514) SpO2:  [87 %-96 %] 96 % (04/28 0514) Weight:  [145.151 kg (320 lb)] 145.151 kg (320 lb) (04/28 0514)  General: WDWN patient in NAD. Psych:  Appropriate mood and affect. Neuro:  A&O x 3, Moving all extremities, sensation intact to light touch HEENT:  EOMs intact Chest:  Even non-labored respirations Skin:  Dressing/splint C/D/I, no rashes or lesions Extremities: warm/dry, mild edema, no erythmea, echymosis to toes.  No lymphadenopathy. Pulses: Popliteus 2+ MSK:  ROM: EHL/FHL intact, MMT: patient can perform quad set    LABS  Recent Labs  10/23/15 0513  HGB 13.2  WBC 8.8  PLT 207   No results for input(s): NA, K, CL, CO2, BUN, CREATININE, GLUCOSE in the last 72 hours. No results for input(s): LABPT, INR in the last 72 hours.   Assessment/Plan: 3 Days Post-Op Procedure(s) (LRB): OPEN REDUCTION INTERNAL FIXATION (ORIF) RIGHT ANKLE TRIMALLEOLAR FRACTURE (Right) REMOVAL EXTERNAL FIXATION  (Right)  Up with therapy  NWB RLE Eliquis for DVT prophylaxis Plan for D/C to SNF today if bed is available.  If not, please call and we will adjust D/C orders. Scripts on chart. Plan for 2 week outpatient post-op visit with Dr. Victorino DikeHewitt.  Alfredo MartinezJustin Littleton Haub, PA-C, ATC [facility] Office:  [phone]

## 2015-10-25 NOTE — Clinical Social Work Note (Signed)
PASARR: [id number] A Obtained on 10/25/2015.  Marisa Deistmily Brantlee Hinde, LCSW [phone] Orthopedics: [phone] Surgical: [phone]

## 2015-10-28 ENCOUNTER — Non-Acute Institutional Stay (SKILLED_NURSING_FACILITY): Payer: Medicare (Managed Care) | Admitting: Adult Health

## 2015-10-28 ENCOUNTER — Encounter: Payer: Self-pay | Admitting: Adult Health

## 2015-10-28 DIAGNOSIS — K5903 Drug induced constipation: Secondary | ICD-10-CM | POA: Diagnosis not present

## 2015-10-28 DIAGNOSIS — I482 Chronic atrial fibrillation, unspecified: Secondary | ICD-10-CM

## 2015-10-28 DIAGNOSIS — S82851D Displaced trimalleolar fracture of right lower leg, subsequent encounter for closed fracture with routine healing: Secondary | ICD-10-CM | POA: Diagnosis not present

## 2015-10-28 DIAGNOSIS — K219 Gastro-esophageal reflux disease without esophagitis: Secondary | ICD-10-CM | POA: Insufficient documentation

## 2015-10-28 DIAGNOSIS — T402X5A Adverse effect of other opioids, initial encounter: Secondary | ICD-10-CM | POA: Diagnosis not present

## 2015-10-28 DIAGNOSIS — I10 Essential (primary) hypertension: Secondary | ICD-10-CM

## 2015-10-28 DIAGNOSIS — E119 Type 2 diabetes mellitus without complications: Secondary | ICD-10-CM

## 2015-10-28 DIAGNOSIS — R059 Cough, unspecified: Secondary | ICD-10-CM

## 2015-10-28 DIAGNOSIS — R600 Localized edema: Secondary | ICD-10-CM | POA: Diagnosis not present

## 2015-10-28 DIAGNOSIS — R05 Cough: Secondary | ICD-10-CM

## 2015-10-28 DIAGNOSIS — F418 Other specified anxiety disorders: Secondary | ICD-10-CM | POA: Diagnosis not present

## 2015-10-28 NOTE — Progress Notes (Signed)
Patient ID: Marisa Tran, female   DOB: 05-03-1946, 70 y.o.   MRN: [id number]   Facility: [facility]     CODE STATUS: Full Code  Allergies  Allergen Reactions  . Iodine Anaphylaxis    Chief Complaint  Patient presents with  . Hospitalization Follow-up    Hospital follow up    HPI:  She has been hospitalized following a right ankle fracture. She is here for short term rehab with her goal to return back home. She has been having some nausea. No constipation. Pain is being managed. There are no nursing concerns at this time.   Social History   Social History  . Marital Status: Married    Spouse Name: N/A  . Number of Children: N/A  . Years of Education: N/A   Occupational History  . Not on file.   Social History Main Topics  . Smoking status: Never Smoker   . Smokeless tobacco: Not on file  . Alcohol Use: Yes     Comment: maybe once every 3 months  . Drug Use: Not on file  . Sexual Activity: Not on file   Other Topics Concern  . Not on file   Social History Narrative   History reviewed. No pertinent family history.    There is no immunization history on file for this patient.   Past Medical History  Diagnosis Date  . Hypertension   . Atrial fibrillation (HCC)   . Chronic sinus infection     Past Surgical History  Procedure Laterality Date  . Cataract extraction, bilateral    . Ankle closed reduction Right 10/17/2015    Procedure: CLOSED REDUCTION ANKLE;  Surgeon: Toni Arthurs, MD;  Location: [facility] OR;  Service: Orthopedics;  Laterality: Right;  . External fixation leg Right 10/17/2015    Procedure: EXTERNAL FIXATION RIGHT ANKLE;  Surgeon: Toni Arthurs, MD;  Location: [facility] OR;  Service: Orthopedics;  Laterality: Right;  . Orif ankle fracture Right 10/22/2015    Procedure: OPEN REDUCTION INTERNAL FIXATION (ORIF) RIGHT ANKLE TRIMALLEOLAR FRACTURE;  Surgeon: Toni Arthurs, MD;  Location: [facility] OR;  Service: Orthopedics;  Laterality: Right;  . External fixation removal  Right 10/22/2015    Procedure: REMOVAL EXTERNAL FIXATION ;  Surgeon: Toni Arthurs, MD;  Location: [facility] OR;  Service: Orthopedics;  Laterality: Right;    VITAL SIGNS BP 131/57 mmHg  Pulse 82  Temp(Src) 97.7 F (36.5 C) (Oral)  Resp 18  Ht  (1.651 m)  Wt 304 lb (137.893 kg)  BMI 50.59 kg/m2  SpO2 97%  Patient's Medications  New Prescriptions   No medications on file  Previous Medications   ACETAMINOPHEN (TYLENOL) 500 MG TABLET    Take 1,000 mg by mouth every 6 (six) hours as needed for mild pain, moderate pain or headache.   AMLODIPINE (NORVASC) 2.5 MG TABLET    Take 2.5 mg by mouth daily.   APIXABAN (ELIQUIS) 5 MG TABS TABLET    Take 5 mg by mouth 2 (two) times daily.   ATENOLOL (TENORMIN) 100 MG TABLET    Take 100 mg by mouth daily.   CITALOPRAM (CELEXA) 20 MG TABLET    Take 20 mg by mouth daily.   DEXTROMETHORPHAN-GUAIFENESIN (MUCINEX DM) 30-600 MG 12HR TABLET    Take 1 tablet by mouth 2 (two) times daily.   DOCUSATE SODIUM (COLACE) 100 MG CAPSULE    Take 1 capsule (100 mg total) by mouth 2 (two) times daily. While taking narcotic pain medicine.   IBUPROFEN (ADVIL,MOTRIN) 200  MG TABLET    Take 400 mg by mouth every 6 (six) hours as needed for headache, mild pain or moderate pain.   INSULIN ASPART (NOVOLOG) 100 UNIT/ML INJECTION    Inject 0-9 Units into the skin 3 (three) times daily with meals.   LOPERAMIDE (IMODIUM A-D) 2 MG TABLET    Take 2 mg by mouth 4 (four) times daily as needed for diarrhea or loose stools.   OMEPRAZOLE (PRILOSEC) 20 MG CAPSULE    Take 20 mg by mouth daily.   OXYCODONE (ROXICODONE) 5 MG IMMEDIATE RELEASE TABLET    Take 1-2 tablets (5-10 mg total) by mouth every 4 (four) hours as needed for moderate pain or severe pain.   SENNA (SENOKOT) 8.6 MG TABS TABLET    Take 2 tablets (17.2 mg total) by mouth 2 (two) times daily.   TRIAMTERENE-HYDROCHLOROTHIAZIDE (MAXZIDE) 75-50 MG TABLET    Take 1 tablet by mouth daily.  Modified Medications   No medications on file    Discontinued Medications   No medications on file     SIGNIFICANT DIAGNOSTIC EXAMS  10-16-15: right ankle x-ray: Severe comminuted and displaced fractures of the right ankle as described above.  10-17-15: right foot x-ray: Severely comminuted and displaced fractures of the right ankle. See additional report. No additional fractures demonstrated on limited imaging of right foot.  10-17-15: right tibia/fibula x-ray: No additional fractures demonstrated to the proximal/mid right tibia and fibula.  10-17-15: right ankle x-ray: Some improvement in alignment of the ankle fracture dislocation postreduction, however persistent dislocation of the tibial talar joint.   10-18-15: chest x-ray: Low lung volumes.  No active disease.  LABS REVIEWED:  10-17-15: wbc 12.7; hgb 14.2; hct 41.5; mcv 83.;8 plt 241; glucose 200; bun 31; creat 1.04; k+ 3.7; na++139; hgb a1c 6.8 10-19-15: wbc 10.8; hgb 13.0; hct 41.9; mcv 89.1 plt 165;  10-21-15: wbc 9.0; hgb 12.;2 hct 38.3; mcv 86.8; plt 162    Review of Systems  Constitutional: Negative for malaise/fatigue.  Respiratory: Negative for cough and shortness of breath.   Cardiovascular: Negative for chest pain, palpitations and leg swelling.  Gastrointestinal: Positive for nausea. Negative for vomiting, abdominal pain and constipation.  Musculoskeletal: Negative for myalgias, back pain and joint pain.       Is status post right ankle fracture   Skin: Negative.   Neurological: Negative for dizziness.  Psychiatric/Behavioral: The patient is not nervous/anxious.     Physical Exam  Constitutional: She is oriented to person, place, and time. She appears well-developed and well-nourished. No distress.  Obese   Eyes: Conjunctivae are normal.  Neck: Neck supple. No JVD present. No thyromegaly present.  Cardiovascular: Normal rate, regular rhythm and intact distal pulses.   Respiratory: Effort normal and breath sounds normal. No respiratory distress. She has no  wheezes.  GI: Soft. Bowel sounds are normal. She exhibits no distension. There is no tenderness.  Musculoskeletal: She exhibits no edema.  Able to move all extremities Is status post right ankle fracture   Lymphadenopathy:    She has no cervical adenopathy.  Neurological: She is alert and oriented to person, place, and time.  Skin: Skin is warm and dry. She is not diaphoretic.  Right ankle is coban wrap toes bruised; warm and able to move   Psychiatric: She has a normal mood and affect.         ASSESSMENT/ PLAN:  1. Afib: heart rate is stable; will continue eliquis 5 mg twice daily takes atenolol 100 mg  daily for rate control  2. Hypertension: will continue  norvasc 2.5 mg daily atenolol 100 mg daily; maxide 75-50 mg daily   3. Lower extremity edema: will continue maxide 75-50 mg daily  4. Gerd: will continue prilosec 20 mg daily   5. Constipation: will continue senna 2 tabs twice daily  6. Depression with anxiety: will continue celexa 20 mg daily   7. Cough: will continue mucinex dm twice daily   8. Trimalleolar fracture on right: will continue to follow up with orthopedics; will continue therapy as directed; will continue motrin 400 mg every 6 hours as needed and oxycodone 5 or 10 mg every 4 hours as needed for pain .   9. Diabetes: hgb a1c is 6.8; is diet controlled; will stop novolog and will continue to monitor cbg twice daily will monitor    Time spent with patient  50  minutes >50% time spent counseling; reviewing medical record; tests; labs; and developing future plan of care        Synthia Innocent NP [facility]  Contact [phone] Monday through Friday 8am- 5pm  After hours call [phone]

## 2015-10-29 ENCOUNTER — Non-Acute Institutional Stay (SKILLED_NURSING_FACILITY): Payer: Medicare (Managed Care) | Admitting: Internal Medicine

## 2015-10-29 ENCOUNTER — Encounter: Payer: Self-pay | Admitting: Internal Medicine

## 2015-10-29 DIAGNOSIS — Z967 Presence of other bone and tendon implants: Secondary | ICD-10-CM

## 2015-10-29 DIAGNOSIS — R05 Cough: Secondary | ICD-10-CM | POA: Diagnosis not present

## 2015-10-29 DIAGNOSIS — Z8781 Personal history of (healed) traumatic fracture: Secondary | ICD-10-CM

## 2015-10-29 DIAGNOSIS — I482 Chronic atrial fibrillation, unspecified: Secondary | ICD-10-CM

## 2015-10-29 DIAGNOSIS — E119 Type 2 diabetes mellitus without complications: Secondary | ICD-10-CM | POA: Diagnosis not present

## 2015-10-29 DIAGNOSIS — R600 Localized edema: Secondary | ICD-10-CM

## 2015-10-29 DIAGNOSIS — F418 Other specified anxiety disorders: Secondary | ICD-10-CM | POA: Diagnosis not present

## 2015-10-29 DIAGNOSIS — R059 Cough, unspecified: Secondary | ICD-10-CM

## 2015-10-29 DIAGNOSIS — I10 Essential (primary) hypertension: Secondary | ICD-10-CM | POA: Diagnosis not present

## 2015-10-29 DIAGNOSIS — Z9889 Other specified postprocedural states: Secondary | ICD-10-CM

## 2015-10-29 DIAGNOSIS — K219 Gastro-esophageal reflux disease without esophagitis: Secondary | ICD-10-CM | POA: Diagnosis not present

## 2015-10-29 NOTE — Progress Notes (Signed)
Patient ID: Marisa Tran, female   DOB: 1945/11/13, 70 y.o.   MRN: [id number]    HISTORY AND PHYSICAL   DATE: 10/29/15  Location:  [facility] Room Number: 144 A Place of Service: SNF (31)   Extended Emergency Contact Information Primary Emergency Contact: [employer] [place] Phone: [phone] Relation: Significant other Secondary Emergency Contact: [facility] Address: [address] [place] [address] [place] Phone: [phone] Relation: Daughter  Advanced Directive information Does patient have an advance directive?: No, Would patient like information on creating an advanced directive?: No - patient declined information FULL CODE Chief Complaint  Patient presents with  . New Admit To SNF    HPI:  70 yo female seen today as a new admission into SNF following hospital stay for right ankle fx, depression/anxiety, PAF, HTN, DM, GERD. She underwent right ankle ORIF 4/25th. No postop complications. Her eliquis was held. She presents to SNF for short term rehab.   She reports her pain is well controlled. She has increased anxiety and is sitting in bed crying. She reports feeling overwhelmed as she is unable to use her bedside commode without assistance. She is tolerating PT/OT. No falls. No nursing issues. She is a poor historian due to psych issues. Hx obtained from chart  PAF - rate controlled on atenolol. Takes eliquis 5 mg twice daily   Hypertension - BP controlled on norvasc 2.5 mg daily; atenolol 100 mg daily; maxide 75-50 mg daily   Lower extremity edema - stable on maxide 75-50 mg daily  GERD - stable on prilosec 20 mg daily   Constipation - stable on senna 2 tabs twice daily  Depression with anxiety - uncontrolled anxiety on celexa 20 mg daily   Cough - chronic; takes mucinex dm twice daily   DM -  hgb a1c is 6.8;  diet controlled;  cbg twice daily will monitor  Past Medical  History  Diagnosis Date  . Hypertension   . Atrial fibrillation ([place])   . Chronic sinus infection     Past Surgical History  Procedure Laterality Date  . Cataract extraction, bilateral    . Ankle closed reduction Right 10/17/2015    Procedure: CLOSED REDUCTION ANKLE;  Surgeon: Wylene Simmer, MD;  Location: [facility];  Service: Orthopedics;  Laterality: Right;  . External fixation leg Right 10/17/2015    Procedure: EXTERNAL FIXATION RIGHT ANKLE;  Surgeon: Wylene Simmer, MD;  Location: [place];  Service: Orthopedics;  Laterality: Right;  . Orif ankle fracture Right 10/22/2015    Procedure: OPEN REDUCTION INTERNAL FIXATION (ORIF) RIGHT ANKLE TRIMALLEOLAR FRACTURE;  Surgeon: Wylene Simmer, MD;  Location: [facility];  Service: Orthopedics;  Laterality: Right;  . External fixation removal Right 10/22/2015    Procedure: REMOVAL EXTERNAL FIXATION ;  Surgeon: Wylene Simmer, MD;  Location: [facility];  Service: Orthopedics;  Laterality: Right;    Patient Care Team: Pcp Not In System as PCP - General  Social History   Social History  . Marital Status: Married    Spouse Name: N/A  . Number of Children: N/A  . Years of Education: N/A   Occupational History  . Not on file.   Social History Main Topics  . Smoking status: Never Smoker   . Smokeless tobacco: Not on file  . Alcohol Use: Yes     Comment: maybe once every 3 months  . Drug Use: Not  on file  . Sexual Activity: Not on file   Other Topics Concern  . Not on file   Social History Narrative     reports that she has never smoked. She does not have any smokeless tobacco history on file. She reports that she drinks alcohol. Her drug history is not on file.  History reviewed. No pertinent family history. No family status information on file.     There is no immunization history on file for this patient.  Allergies  Allergen Reactions  . Iodine Anaphylaxis    Medications: Patient's Medications  New Prescriptions   No medications on file    Previous Medications   ACETAMINOPHEN (TYLENOL) 500 MG TABLET    Take 1,000 mg by mouth every 6 (six) hours as needed for mild pain, moderate pain or headache.   AMLODIPINE (NORVASC) 2.5 MG TABLET    Take 2.5 mg by mouth daily.   APIXABAN (ELIQUIS) 5 MG TABS TABLET    Take 5 mg by mouth 2 (two) times daily.   ATENOLOL (TENORMIN) 100 MG TABLET    Take 100 mg by mouth daily.   CITALOPRAM (CELEXA) 20 MG TABLET    Take 20 mg by mouth daily.   DEXTROMETHORPHAN-GUAIFENESIN (MUCINEX DM) 30-600 MG 12HR TABLET    Take 1 tablet by mouth 2 (two) times daily.   DOCUSATE SODIUM (COLACE) 100 MG CAPSULE    Take 1 capsule (100 mg total) by mouth 2 (two) times daily. While taking narcotic pain medicine.   IBUPROFEN (ADVIL,MOTRIN) 200 MG TABLET    Take 400 mg by mouth every 6 (six) hours as needed for headache, mild pain or moderate pain.   INSULIN ASPART (NOVOLOG) 100 UNIT/ML INJECTION    Inject 0-9 Units into the skin 3 (three) times daily with meals.   LOPERAMIDE (IMODIUM A-D) 2 MG TABLET    Take 2 mg by mouth 4 (four) times daily as needed for diarrhea or loose stools.   OMEPRAZOLE (PRILOSEC) 20 MG CAPSULE    Take 20 mg by mouth daily.   OXYCODONE (ROXICODONE) 5 MG IMMEDIATE RELEASE TABLET    Take 1-2 tablets (5-10 mg total) by mouth every 4 (four) hours as needed for moderate pain or severe pain.   SENNA (SENOKOT) 8.6 MG TABS TABLET    Take 2 tablets (17.2 mg total) by mouth 2 (two) times daily.   TRIAMTERENE-HYDROCHLOROTHIAZIDE (MAXZIDE) 75-50 MG TABLET    Take 1 tablet by mouth daily.  Modified Medications   No medications on file  Discontinued Medications   No medications on file    Review of Systems  Unable to perform ROS: Psychiatric disorder    Filed Vitals:   10/29/15 1024  BP: 130/66  Pulse: 82  Temp: 98.5 F (36.9 C)  TempSrc: Oral  Resp: 18  Height: 5' 5"  (1.651 m)  Weight: 304 lb (137.893 kg)  SpO2: 97%   Body mass index is 50.59 kg/(m^2).  Physical Exam  Constitutional: She  appears well-developed and well-nourished.  Sitting up in bed in NAD, crying.  HENT:  Mouth/Throat: Oropharynx is clear and moist. No oropharyngeal exudate.  Eyes: Pupils are equal, round, and reactive to light. No scleral icterus.  Neck: Neck supple. Carotid bruit is not present. No tracheal deviation present.  Cardiovascular: Normal rate, regular rhythm and intact distal pulses.  Frequent extrasystoles are present. Exam reveals no gallop and no friction rub.   Murmur heard.  Systolic murmur is present with a grade of 1/6  +1  pitting LLE edema. RLE in cast. No Left calf TTP  Pulmonary/Chest: Effort normal and breath sounds normal. No stridor. No respiratory distress. She has no wheezes. She has no rales.  Abdominal: Soft. Bowel sounds are normal. She exhibits no distension and no mass. There is no hepatomegaly. There is no tenderness. There is no rebound and no guarding.  Musculoskeletal: She exhibits edema.  RLE cast intact with FROM toes  Lymphadenopathy:    She has no cervical adenopathy.  Neurological: She is alert.  Skin: Skin is warm and dry. No rash noted.  RLE contusions  Psychiatric: Her behavior is normal. Thought content normal. Her mood appears anxious.     Labs reviewed: Admission on 10/16/2015, Discharged on 10/25/2015  Component Date Value Ref Range Status  . Sodium 10/17/2015 139  135 - 145 mmol/L Final  . Potassium 10/17/2015 3.7  3.5 - 5.1 mmol/L Final  . Chloride 10/17/2015 103  101 - 111 mmol/L Final  . CO2 10/17/2015 25  22 - 32 mmol/L Final  . Glucose, Bld 10/17/2015 200* 65 - 99 mg/dL Final  . BUN 10/17/2015 31* 6 - 20 mg/dL Final  . Creatinine, Ser 10/17/2015 1.04* 0.44 - 1.00 mg/dL Final  . Calcium 10/17/2015 8.4* 8.9 - 10.3 mg/dL Final  . GFR calc non Af Amer 10/17/2015 53* >60 mL/min Final  . GFR calc Af Amer 10/17/2015 >60  >60 mL/min Final   Comment: (NOTE) The eGFR has been calculated using the CKD EPI equation. This calculation has not been  validated in all clinical situations. eGFR's persistently <60 mL/min signify possible Chronic Kidney Disease.   . Anion gap 10/17/2015 11  5 - 15 Final  . WBC 10/17/2015 12.7* 4.0 - 10.5 K/uL Final  . RBC 10/17/2015 4.95  3.87 - 5.11 MIL/uL Final  . Hemoglobin 10/17/2015 14.2  12.0 - 15.0 g/dL Final  . HCT 10/17/2015 41.5  36.0 - 46.0 % Final  . MCV 10/17/2015 83.8  78.0 - 100.0 fL Final  . MCH 10/17/2015 28.7  26.0 - 34.0 pg Final  . MCHC 10/17/2015 34.2  30.0 - 36.0 g/dL Final  . RDW 10/17/2015 14.2  11.5 - 15.5 % Final  . Platelets 10/17/2015 241  150 - 400 K/uL Final  . Neutrophils Relative % 10/17/2015 83   Final  . Neutro Abs 10/17/2015 10.5* 1.7 - 7.7 K/uL Final  . Lymphocytes Relative 10/17/2015 11   Final  . Lymphs Abs 10/17/2015 1.4  0.7 - 4.0 K/uL Final  . Monocytes Relative 10/17/2015 5   Final  . Monocytes Absolute 10/17/2015 0.7  0.1 - 1.0 K/uL Final  . Eosinophils Relative 10/17/2015 1   Final  . Eosinophils Absolute 10/17/2015 0.1  0.0 - 0.7 K/uL Final  . Basophils Relative 10/17/2015 0   Final  . Basophils Absolute 10/17/2015 0.0  0.0 - 0.1 K/uL Final  . MRSA, PCR 10/17/2015 NEGATIVE  NEGATIVE Final  . Staphylococcus aureus 10/17/2015 NEGATIVE  NEGATIVE Final   Comment:        The Xpert SA Assay (FDA approved for NASAL specimens in patients over 37 years of age), is one component of a comprehensive surveillance program.  Test performance has been validated by [facility] for patients greater than or equal to 58 year old. It is not intended to diagnose infection nor to guide or monitor treatment.   . Hgb A1c MFr Bld 10/17/2015 6.8* 4.8 - 5.6 % Final   Comment: (NOTE)  Pre-diabetes: 5.7 - 6.4         Diabetes: >6.4         Glycemic control for adults with diabetes: <7.0   . Mean Plasma Glucose 10/17/2015 148   Final   Comment: (NOTE) Performed At: [facility] [place], [place] [id number] Lindon Romp MD [id number]     . Glucose-Capillary 10/17/2015 171* 65 - 99 mg/dL Final  . Glucose-Capillary 10/17/2015 181* 65 - 99 mg/dL Final  . Comment 1 10/17/2015 Notify RN   Final  . Glucose-Capillary 10/18/2015 140* 65 - 99 mg/dL Final  . Glucose-Capillary 10/18/2015 133* 65 - 99 mg/dL Final  . Heparin Unfractionated 10/19/2015 0.97* 0.30 - 0.70 IU/mL Final   Comment:        IF HEPARIN RESULTS ARE BELOW EXPECTED VALUES, AND PATIENT DOSAGE HAS BEEN CONFIRMED, SUGGEST FOLLOW UP TESTING OF ANTITHROMBIN III LEVELS.   . WBC 10/19/2015 10.8* 4.0 - 10.5 K/uL Final  . RBC 10/19/2015 4.70  3.87 - 5.11 MIL/uL Final  . Hemoglobin 10/19/2015 13.0  12.0 - 15.0 g/dL Final  . HCT 10/19/2015 41.9  36.0 - 46.0 % Final  . MCV 10/19/2015 89.1  78.0 - 100.0 fL Final  . MCH 10/19/2015 27.7  26.0 - 34.0 pg Final  . MCHC 10/19/2015 31.0  30.0 - 36.0 g/dL Final  . RDW 10/19/2015 14.4  11.5 - 15.5 % Final  . Platelets 10/19/2015 165  150 - 400 K/uL Final  . Glucose-Capillary 10/18/2015 130* 65 - 99 mg/dL Final  . aPTT 10/19/2015 85* 24 - 37 seconds Final   Comment:        IF BASELINE aPTT IS ELEVATED, SUGGEST PATIENT RISK ASSESSMENT BE USED TO DETERMINE APPROPRIATE ANTICOAGULANT THERAPY.   . Glucose-Capillary 10/18/2015 168* 65 - 99 mg/dL Final  . Comment 1 10/18/2015 Notify RN   Final  . aPTT 10/19/2015 85* 24 - 37 seconds Final   Comment:        IF BASELINE aPTT IS ELEVATED, SUGGEST PATIENT RISK ASSESSMENT BE USED TO DETERMINE APPROPRIATE ANTICOAGULANT THERAPY.   . Glucose-Capillary 10/19/2015 104* 65 - 99 mg/dL Final  . Glucose-Capillary 10/19/2015 115* 65 - 99 mg/dL Final  . aPTT 10/20/2015 85* 24 - 37 seconds Final   Comment:        IF BASELINE aPTT IS ELEVATED, SUGGEST PATIENT RISK ASSESSMENT BE USED TO DETERMINE APPROPRIATE ANTICOAGULANT THERAPY.   . Heparin Unfractionated 10/20/2015 0.74* 0.30 - 0.70 IU/mL Final   Comment:        IF HEPARIN RESULTS ARE BELOW EXPECTED VALUES, AND PATIENT DOSAGE HAS BEEN  CONFIRMED, SUGGEST FOLLOW UP TESTING OF ANTITHROMBIN III LEVELS.   . WBC 10/20/2015 10.5  4.0 - 10.5 K/uL Final  . RBC 10/20/2015 4.59  3.87 - 5.11 MIL/uL Final  . Hemoglobin 10/20/2015 12.6  12.0 - 15.0 g/dL Final  . HCT 10/20/2015 40.1  36.0 - 46.0 % Final  . MCV 10/20/2015 87.4  78.0 - 100.0 fL Final  . MCH 10/20/2015 27.5  26.0 - 34.0 pg Final  . MCHC 10/20/2015 31.4  30.0 - 36.0 g/dL Final  . RDW 10/20/2015 14.4  11.5 - 15.5 % Final  . Platelets 10/20/2015 181  150 - 400 K/uL Final  . Glucose-Capillary 10/19/2015 116* 65 - 99 mg/dL Final  . Glucose-Capillary 10/19/2015 143* 65 - 99 mg/dL Final  . Comment 1 10/19/2015 Notify RN   Final  . Comment 2 10/19/2015 Document in Chart   Final  .  Glucose-Capillary 10/20/2015 111* 65 - 99 mg/dL Final  . Glucose-Capillary 10/20/2015 140* 65 - 99 mg/dL Final  . Heparin Unfractionated 10/21/2015 0.61  0.30 - 0.70 IU/mL Final   Comment:        IF HEPARIN RESULTS ARE BELOW EXPECTED VALUES, AND PATIENT DOSAGE HAS BEEN CONFIRMED, SUGGEST FOLLOW UP TESTING OF ANTITHROMBIN III LEVELS.   . WBC 10/21/2015 9.0  4.0 - 10.5 K/uL Final  . RBC 10/21/2015 4.41  3.87 - 5.11 MIL/uL Final  . Hemoglobin 10/21/2015 12.2  12.0 - 15.0 g/dL Final  . HCT 10/21/2015 38.3  36.0 - 46.0 % Final  . MCV 10/21/2015 86.8  78.0 - 100.0 fL Final  . MCH 10/21/2015 27.7  26.0 - 34.0 pg Final  . MCHC 10/21/2015 31.9  30.0 - 36.0 g/dL Final  . RDW 10/21/2015 14.1  11.5 - 15.5 % Final  . Platelets 10/21/2015 162  150 - 400 K/uL Final  . Glucose-Capillary 10/20/2015 155* 65 - 99 mg/dL Final  . aPTT 10/21/2015 79* 24 - 37 seconds Final   Comment:        IF BASELINE aPTT IS ELEVATED, SUGGEST PATIENT RISK ASSESSMENT BE USED TO DETERMINE APPROPRIATE ANTICOAGULANT THERAPY.   . Glucose-Capillary 10/20/2015 155* 65 - 99 mg/dL Final  . Comment 1 10/20/2015 Notify RN   Final  . Comment 2 10/20/2015 Document in Chart   Final  . Glucose-Capillary 10/21/2015 108* 65 - 99 mg/dL  Final  . Glucose-Capillary 10/21/2015 110* 65 - 99 mg/dL Final  . aPTT 10/22/2015 34  24 - 37 seconds Final  . Heparin Unfractionated 10/22/2015 0.15* 0.30 - 0.70 IU/mL Final   Comment:        IF HEPARIN RESULTS ARE BELOW EXPECTED VALUES, AND PATIENT DOSAGE HAS BEEN CONFIRMED, SUGGEST FOLLOW UP TESTING OF ANTITHROMBIN III LEVELS.   . WBC 10/22/2015 7.9  4.0 - 10.5 K/uL Final  . RBC 10/22/2015 4.55  3.87 - 5.11 MIL/uL Final  . Hemoglobin 10/22/2015 12.5  12.0 - 15.0 g/dL Final  . HCT 10/22/2015 39.0  36.0 - 46.0 % Final  . MCV 10/22/2015 85.7  78.0 - 100.0 fL Final  . MCH 10/22/2015 27.5  26.0 - 34.0 pg Final  . MCHC 10/22/2015 32.1  30.0 - 36.0 g/dL Final  . RDW 10/22/2015 14.4  11.5 - 15.5 % Final  . Platelets 10/22/2015 184  150 - 400 K/uL Final  . Glucose-Capillary 10/21/2015 157* 65 - 99 mg/dL Final  . Glucose-Capillary 10/21/2015 127* 65 - 99 mg/dL Final  . Glucose-Capillary 10/22/2015 113* 65 - 99 mg/dL Final  . Glucose-Capillary 10/22/2015 119* 65 - 99 mg/dL Final  . aPTT 10/23/2015 27  24 - 37 seconds Final  . WBC 10/23/2015 8.8  4.0 - 10.5 K/uL Final  . RBC 10/23/2015 4.76  3.87 - 5.11 MIL/uL Final  . Hemoglobin 10/23/2015 13.2  12.0 - 15.0 g/dL Final  . HCT 10/23/2015 41.4  36.0 - 46.0 % Final  . MCV 10/23/2015 87.0  78.0 - 100.0 fL Final  . MCH 10/23/2015 27.7  26.0 - 34.0 pg Final  . MCHC 10/23/2015 31.9  30.0 - 36.0 g/dL Final  . RDW 10/23/2015 14.7  11.5 - 15.5 % Final  . Platelets 10/23/2015 207  150 - 400 K/uL Final  . Glucose-Capillary 10/22/2015 122* 65 - 99 mg/dL Final  . Glucose-Capillary 10/22/2015 133* 65 - 99 mg/dL Final  . Glucose-Capillary 10/22/2015 198* 65 - 99 mg/dL Final  . Comment 1 10/22/2015 Notify RN  Final  . Comment 2 10/22/2015 Document in Chart   Final  . Glucose-Capillary 10/23/2015 148* 65 - 99 mg/dL Final  . Comment 1 10/23/2015 Notify RN   Final  . Glucose-Capillary 10/23/2015 113* 65 - 99 mg/dL Final  . Comment 1 10/23/2015 Notify  RN   Final  . aPTT 10/24/2015 29  24 - 37 seconds Final  . Glucose-Capillary 10/23/2015 139* 65 - 99 mg/dL Final  . Glucose-Capillary 10/23/2015 108* 65 - 99 mg/dL Final  . Comment 1 10/23/2015 Notify RN   Final  . Comment 2 10/23/2015 Document in Chart   Final  . Glucose-Capillary 10/24/2015 108* 65 - 99 mg/dL Final  . Glucose-Capillary 10/24/2015 105* 65 - 99 mg/dL Final  . aPTT 10/25/2015 34  24 - 37 seconds Final  . Glucose-Capillary 10/24/2015 106* 65 - 99 mg/dL Final  . Glucose-Capillary 10/24/2015 122* 65 - 99 mg/dL Final  . Glucose-Capillary 10/25/2015 125* 65 - 99 mg/dL Final  . Glucose-Capillary 10/25/2015 101* 65 - 99 mg/dL Final    Dg Tibia/fibula Right  10/17/2015  CLINICAL DATA:  Twisting injury to the right ankle after a fall. Pain and deformity. EXAM: RIGHT TIBIA AND FIBULA - 2 VIEW COMPARISON:  None. FINDINGS: The distal right tibia and fibula are not included within the field of view and are evaluated on separate imaging of the right ankle. See additional report. The visualized proximal and midportion of the right tibia and fibula appear intact without additional fractures identified. Right knee joint is not displaced with mild degenerative changes present. Visualized soft tissues are unremarkable. IMPRESSION: No additional fractures demonstrated to the proximal/mid right tibia and fibula. Electronically Signed   By: Lucienne Capers M.D.   On: 10/17/2015 00:02   Dg Ankle 2 Views Right  10/17/2015  CLINICAL DATA:  Ankle fracture postreduction. EXAM: RIGHT ANKLE - 2 VIEW COMPARISON:  Pre reduction exam earlier this day. FINDINGS: Fracture dislocation of the ankle. There is improved alignment, however the talus remains posterior dislocated with respect to the tibia. There is persistent apex medial angulation. The medial malleolar fragment likely remains aligned with the talus. Displaced fibular fracture in improved alignment with persistent angulation. Overlying splint material in  place. IMPRESSION: Some improvement in alignment of the ankle fracture dislocation postreduction, however persistent dislocation of the tibial talar joint. Electronically Signed   By: Jeb Levering M.D.   On: 10/17/2015 02:39   Dg Ankle Complete Right  10/16/2015  CLINICAL DATA:  Trip and fall injury on steps, twisting the right ankle. Deformity and pain. EXAM: RIGHT ANKLE - COMPLETE 3+ VIEW COMPARISON:  None. FINDINGS: Severe comminuted and displaced fractures of the right ankle. Fractures of the medial malleolus of the right ankle with lateral displacement of the distal fracture fragment. Fracture of the anterior malleolus of the distal tibia with a small avulsion fracture off of the posterior malleolus of the distal tibia. There is complete dislocation of the talus with respect to the tibia posteriorly and near complete dislocation of the talus with respect to the tibia laterally. Fracture of the distal right fibula with lateral displacement, overriding, and angulation of the distal fracture fragment. Posterior dislocation of the distal fracture fragment. The talar dome appears intact. Small ossicle over the dorsal aspect of the distal talus likely represents an avulsion fracture at the insertion of the anterior talofibular ligament. IMPRESSION: Severe comminuted and displaced fractures of the right ankle as described above. Electronically Signed   By: Oren Beckmann.D.  On: 10/16/2015 23:59   Dg Chest Port 1 View  10/18/2015  CLINICAL DATA:  Acute respiratory failure with hypoxia. Ankle fracture -dislocation. EXAM: PORTABLE CHEST 1 VIEW COMPARISON:  None. FINDINGS: Low lung volumes are seen however both lungs are clear. No evidence of pneumothorax or pleural effusion. No evidence of pulmonary consolidation or edema. Heart size is within normal limits. IMPRESSION: Low lung volumes.  No active disease. Electronically Signed   By: Earle Gell M.D.   On: 10/18/2015 19:34   Dg Foot 2 Views  Right  10/17/2015  CLINICAL DATA:  Twisting injury to the right ankle after a fall. Pain and deformity. EXAM: RIGHT FOOT - 2 VIEW COMPARISON:  None. FINDINGS: Severe comminuted and displaced fractures of the right ankle. See additional report of right ankle from today's date. Right foot appears otherwise intact without additional fractures demonstrated. Due to patient positioning, the Lisfranc joint is not well evaluated and injury here is not excluded radiographically. IMPRESSION: Severely comminuted and displaced fractures of the right ankle. See additional report. No additional fractures demonstrated on limited imaging of right foot. Electronically Signed   By: Lucienne Capers M.D.   On: 10/17/2015 00:01     Assessment/Plan   ICD-9-CM ICD-10-CM   1. Depression with anxiety - uncontrolled anxiety 300.4 F41.8   2. Type 2 diabetes mellitus without complication, without long-term current use of insulin (HCC) 250.00 E11.9   3. S/P ORIF (open reduction internal fixation) fracture V45.89 Z96.7    V15.51 Z87.81    right ankle  4. Essential hypertension 401.9 I10   5. Chronic atrial fibrillation (HCC) 427.31 I48.2   6. Bilateral lower extremity edema 782.3 R60.0   7. GERD without esophagitis 530.81 K21.9   8. Cough - chronic 786.2 R05     Start valium 39m 0.5 tab po BID prn anxiety  F/u with Ortho as scheduled  Cont other meds as ordered  PT/OT/ST as ordered  Cont CBGs as ordered  GOAL: short term rehab and d/c home when medically appropriate. Communicated with pt and nursing.  Will follow  Vasili Fok S. CPerlie Gold PNorth Ms [facility] [address]GVici [place] [phone] (Monday-Friday 8 AM - 5 PM) [phone] 5 PM and follow prompts

## 2015-11-13 ENCOUNTER — Encounter: Payer: Self-pay | Admitting: Adult Health

## 2015-11-13 ENCOUNTER — Non-Acute Institutional Stay (SKILLED_NURSING_FACILITY): Payer: Medicare (Managed Care) | Admitting: Adult Health

## 2015-11-13 DIAGNOSIS — I482 Chronic atrial fibrillation, unspecified: Secondary | ICD-10-CM

## 2015-11-13 DIAGNOSIS — F418 Other specified anxiety disorders: Secondary | ICD-10-CM

## 2015-11-13 DIAGNOSIS — R600 Localized edema: Secondary | ICD-10-CM

## 2015-11-13 DIAGNOSIS — S82851D Displaced trimalleolar fracture of right lower leg, subsequent encounter for closed fracture with routine healing: Secondary | ICD-10-CM | POA: Diagnosis not present

## 2015-11-13 DIAGNOSIS — I10 Essential (primary) hypertension: Secondary | ICD-10-CM

## 2015-11-13 NOTE — Progress Notes (Signed)
Patient ID: Marisa Tran, female   DOB: 03/15/1946, 70 y.o.   MRN: [id number]   [facility]  [facility] Room Number: 144-A Place of Service:  SNF (31)    CODE STATUS: Full Code  Allergies  Allergen Reactions  . Iodine Anaphylaxis    Chief Complaint  Patient presents with  . Discharge Note    Discharge from facility    HPI:  She is being discharged to home with home health for pt/ot/rn. She will need a bariatric wheelchair and 3:1 commode. She will need her prescriptions written and will need to follow up with her medical provider. She had been hospitalized for a right ankle fracture and was admitted to this facility for short term rehab and is now ready to return back home.    Past Medical History  Diagnosis Date  . Hypertension   . Atrial fibrillation (HCC)   . Chronic sinus infection     Past Surgical History  Procedure Laterality Date  . Cataract extraction, bilateral    . Ankle closed reduction Right 10/17/2015    Procedure: CLOSED REDUCTION ANKLE;  Surgeon: Toni ArthursJohn Hewitt, MD;  Location: [facility] OR;  Service: Orthopedics;  Laterality: Right;  . External fixation leg Right 10/17/2015    Procedure: EXTERNAL FIXATION RIGHT ANKLE;  Surgeon: Toni ArthursJohn Hewitt, MD;  Location: [facility] OR;  Service: Orthopedics;  Laterality: Right;  . Orif ankle fracture Right 10/22/2015    Procedure: OPEN REDUCTION INTERNAL FIXATION (ORIF) RIGHT ANKLE TRIMALLEOLAR FRACTURE;  Surgeon: Toni ArthursJohn Hewitt, MD;  Location: [facility] OR;  Service: Orthopedics;  Laterality: Right;  . External fixation removal Right 10/22/2015    Procedure: REMOVAL EXTERNAL FIXATION ;  Surgeon: Toni ArthursJohn Hewitt, MD;  Location: [facility] OR;  Service: Orthopedics;  Laterality: Right;    Social History   Social History  . Marital Status: Married    Spouse Name: N/A  . Number of Children: N/A  . Years of Education: N/A   Occupational History  . Not on file.   Social History Main Topics  . Smoking status: Never Smoker   .  Smokeless tobacco: Not on file  . Alcohol Use: Yes     Comment: maybe once every 3 months  . Drug Use: Not on file  . Sexual Activity: Not on file   Other Topics Concern  . Not on file   Social History Narrative   History reviewed. No pertinent family history.  VITAL SIGNS BP 122/65 mmHg  Pulse 78  Temp(Src) 97.8 F (36.6 C) (Oral)  Resp 18  Ht 5\' 6"  (1.676 m)  Wt 286 lb (129.729 kg)  BMI 46.18 kg/m2  SpO2 97%  Patient's Medications  New Prescriptions   No medications on file  Previous Medications   ACETAMINOPHEN (TYLENOL) 500 MG TABLET    Take 1,000 mg by mouth every 6 (six) hours as needed for mild pain, moderate pain or headache.   AMLODIPINE (NORVASC) 2.5 MG TABLET    Take 2.5 mg by mouth daily.   APIXABAN (ELIQUIS) 5 MG TABS TABLET    Take 5 mg by mouth 2 (two) times daily.   ATENOLOL (TENORMIN) 100 MG TABLET    Take 100 mg by mouth daily.   CITALOPRAM (CELEXA) 20 MG TABLET    Take 20 mg by mouth daily.   DEXTROMETHORPHAN-GUAIFENESIN (MUCINEX DM) 30-600 MG 12HR TABLET    Take 1 tablet by mouth 2 (two) times daily.   DIAZEPAM (VALIUM) 2 MG TABLET    Give 0.5 tablet  by mouth every 8 hours prn for anxiety   DOCUSATE SODIUM (COLACE) 100 MG CAPSULE    Take 1 capsule (100 mg total) by mouth 2 (two) times daily. While taking narcotic pain medicine.   IBUPROFEN (ADVIL,MOTRIN) 200 MG TABLET    Take 400 mg by mouth every 6 (six) hours as needed for headache, mild pain or moderate pain.   LOPERAMIDE (IMODIUM A-D) 2 MG TABLET    Take 2 mg by mouth 4 (four) times daily as needed for diarrhea or loose stools.   OMEPRAZOLE (PRILOSEC) 20 MG CAPSULE    Take 20 mg by mouth daily.   ONDANSETRON (ZOFRAN) 4 MG TABLET    Take 4 mg by mouth every 6 (six) hours as needed for nausea or vomiting.   OXYCODONE (ROXICODONE) 5 MG IMMEDIATE RELEASE TABLET    Take 1-2 tablets (5-10 mg total) by mouth every 4 (four) hours as needed for moderate pain or severe pain.   SENNA (SENOKOT) 8.6 MG TABS TABLET     Take 2 tablets (17.2 mg total) by mouth 2 (two) times daily.   TRIAMTERENE-HYDROCHLOROTHIAZIDE (MAXZIDE) 75-50 MG TABLET    Take 1 tablet by mouth daily.  Modified Medications   No medications on file  Discontinued Medications   INSULIN ASPART (NOVOLOG) 100 UNIT/ML INJECTION    Inject 0-9 Units into the skin 3 (three) times daily with meals.     SIGNIFICANT DIAGNOSTIC EXAMS  10-16-15: right ankle x-ray: Severe comminuted and displaced fractures of the right ankle as described above.  10-17-15: right foot x-ray: Severely comminuted and displaced fractures of the right ankle. See additional report. No additional fractures demonstrated on limited imaging of right foot.  10-17-15: right tibia/fibula x-ray: No additional fractures demonstrated to the proximal/mid right tibia and fibula.  10-17-15: right ankle x-ray: Some improvement in alignment of the ankle fracture dislocation postreduction, however persistent dislocation of the tibial talar joint.   10-18-15: chest x-ray: Low lung volumes.  No active disease.  LABS REVIEWED:  10-17-15: wbc 12.7; hgb 14.2; hct 41.5; mcv 83.;8 plt 241; glucose 200; bun 31; creat 1.04; k+ 3.7; na++139; hgb a1c 6.8 10-19-15: wbc 10.8; hgb 13.0; hct 41.9; mcv 89.1 plt 165;  10-21-15: wbc 9.0; hgb 12.;2 hct 38.3; mcv 86.8; plt 162    Review of Systems  Constitutional: Negative for malaise/fatigue.  Respiratory: Negative for cough and shortness of breath.   Cardiovascular: Negative for chest pain, palpitations and leg swelling.  Gastrointestinal: negative for nausea  Negative for vomiting, abdominal pain and constipation.  Musculoskeletal: Negative for myalgias, back pain and joint pain.       Is status post right ankle fracture   Skin: Negative.   Neurological: Negative for dizziness.  Psychiatric/Behavioral: The patient is not nervous/anxious.     Physical Exam  Constitutional: She is oriented to person, place, and time. She appears well-developed and  well-nourished. No distress.  Obese   Eyes: Conjunctivae are normal.  Neck: Neck supple. No JVD present. No thyromegaly present.  Cardiovascular: Normal rate, regular rhythm and intact distal pulses.   Respiratory: Effort normal and breath sounds normal. No respiratory distress. She has no wheezes.  GI: Soft. Bowel sounds are normal. She exhibits no distension. There is no tenderness.  Musculoskeletal: She exhibits no edema.  Able to move all extremities Is status post right ankle fracture   Lymphadenopathy:    She has no cervical adenopathy.  Neurological: She is alert and oriented to person, place, and time.  Skin:  Skin is warm and dry. She is not diaphoretic.  Right ankle cast in place toes bruised; warm and able to move   Psychiatric: She has a normal mood and affect.         ASSESSMENT/ PLAN:   Patient is being discharged with the following home health services:  Pt/ot/rn to evaluate and treat as indicated for strength; gait; balance; adl training and medication management.   Patient is being discharged with the following durable medical equipment:  She requires a standard bariatric wheelchair with cushion elevated leg rests; anti-tippers and brake extensions to allow her to maintain her current level of independence with her adl's which cannot be achieved with a walker; she can self propel. She requires a bariatric 3:1 commode.   Patient has been advised to f/u with their PCP in 1-2 weeks to bring them up to date on their rehab stay.  Social services at facility was responsible for arranging this appointment.  Pt was provided with a 30 day supply of prescriptions for medications and refills must be obtained from their PCP.  For controlled substances, a more limited supply may be provided adequate until PCP appointment only.#10 valium 2 mg tabs; #20 oxycodone 5 mg tabs.     Time spent with patient  45  minutes >50% time spent counseling; reviewing medical record; tests; labs;  and developing future plan of care    Synthia Innocent NP [facility]  Contact [phone] Monday through Friday 8am- 5pm  After hours call [phone]

## 2016-12-07 IMAGING — CR DG FOOT 2V*R*
2 series · 2 of 2 positions shown · non-contrast
Comparison: None.

CLINICAL DATA: Twisting injury to the right ankle after a fall.
Pain and deformity.

EXAM:
RIGHT FOOT - 2 VIEW

[x foot lat right]
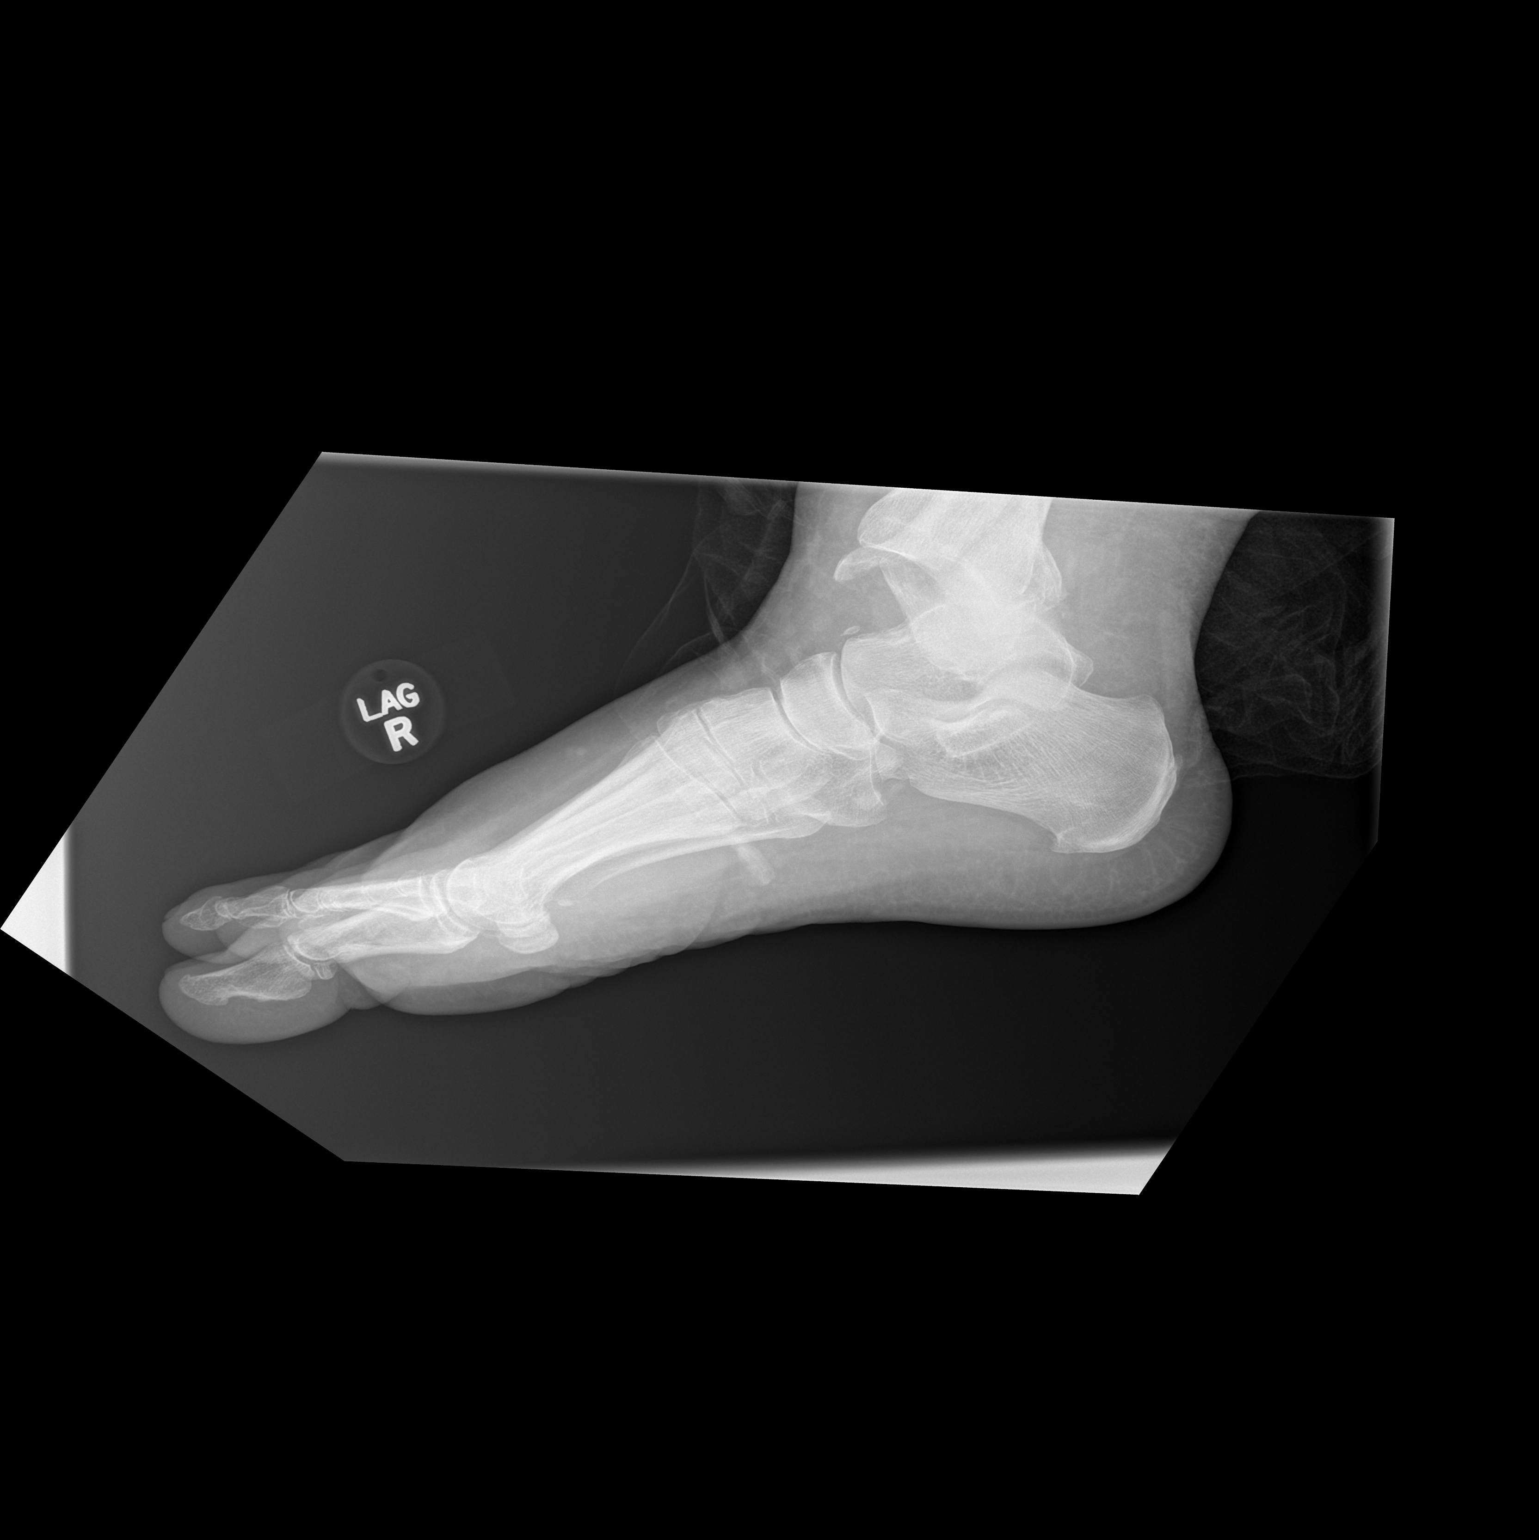

[x foot ap right]
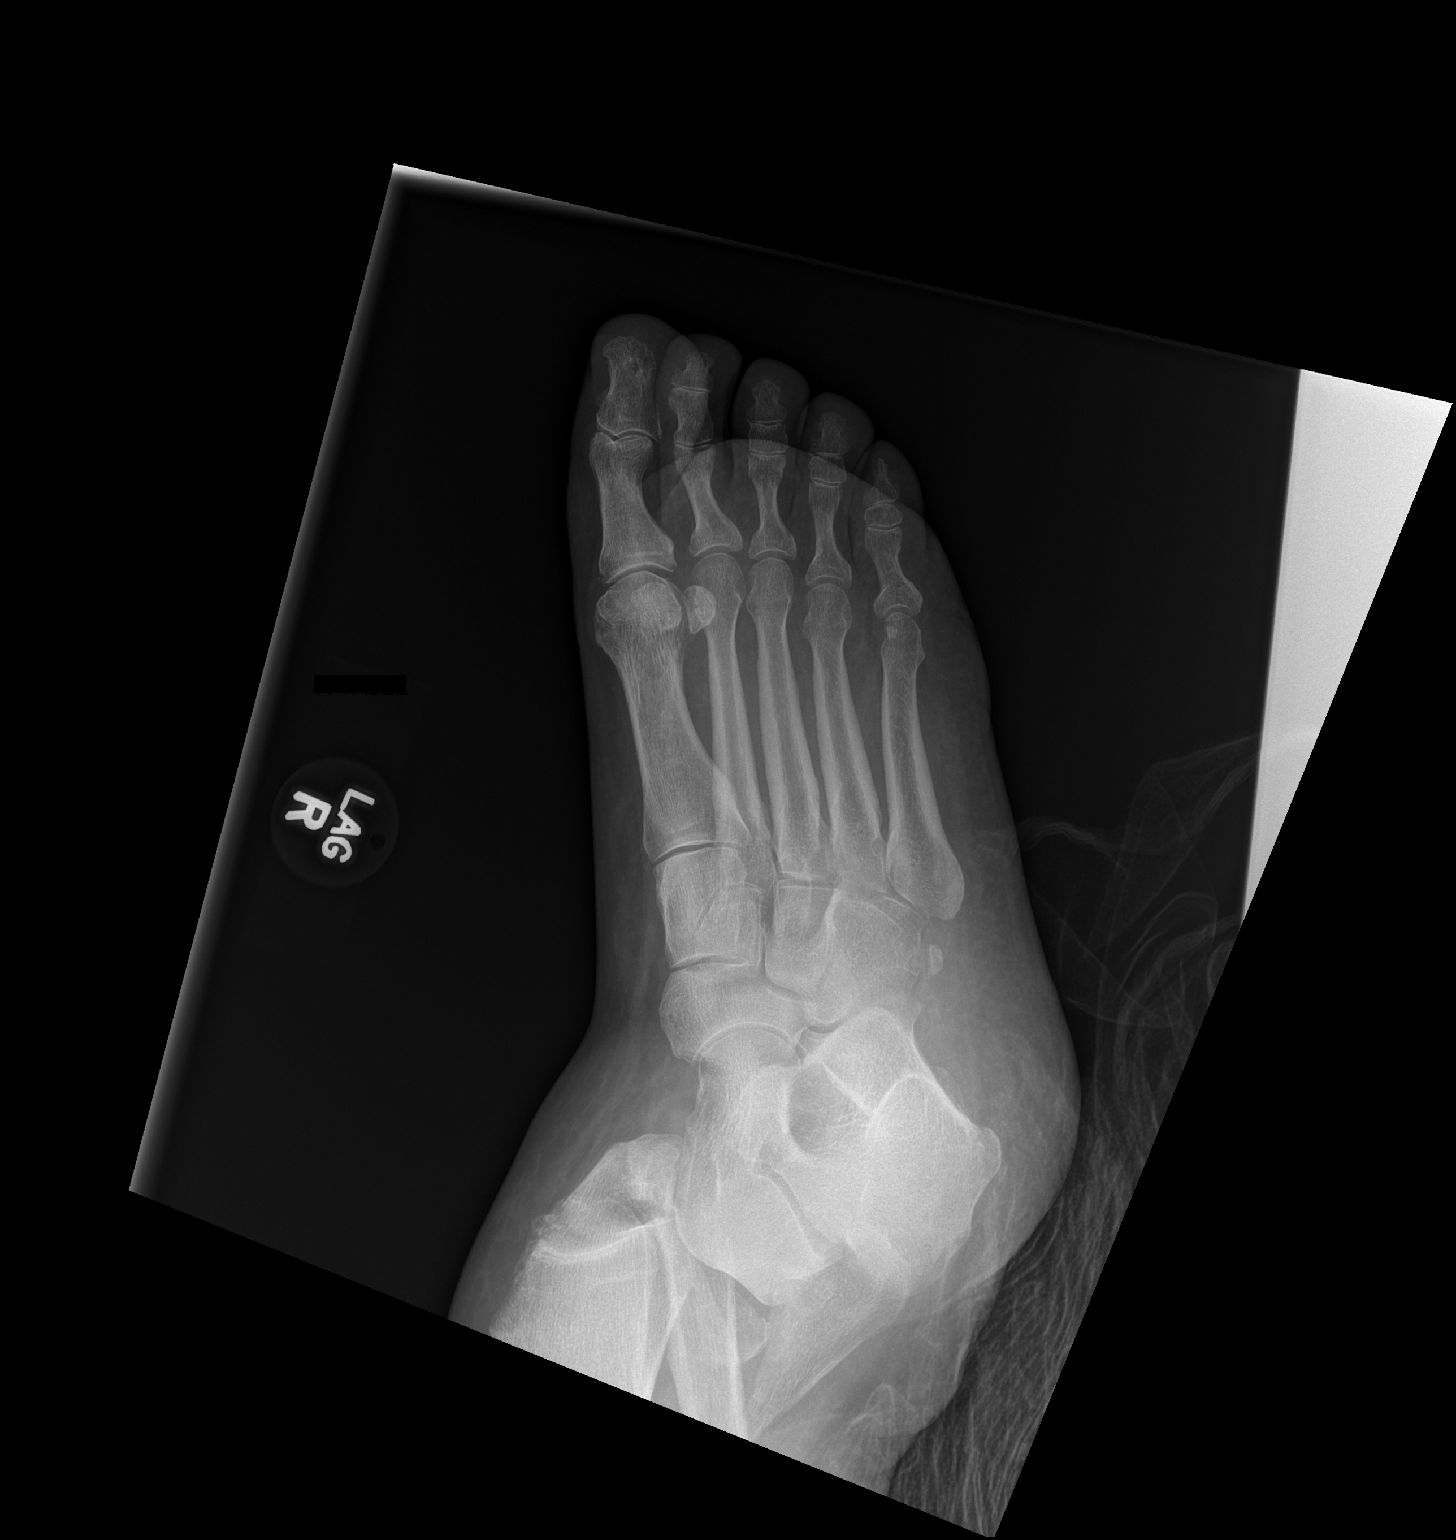

[2 of 2 positions shown; findings below may reference images not displayed]

FINDINGS: Severe comminuted and displaced fractures of the right ankle. See
additional report of right ankle from today's date. Right foot
appears otherwise intact without additional fractures demonstrated.
Due to patient positioning, the Lisfranc joint is not well evaluated
and injury here is not excluded radiographically.
IMPRESSION: Severely comminuted and displaced fractures of the right ankle. See
additional report. No additional fractures demonstrated on limited
imaging of right foot.

## 2016-12-09 IMAGING — CR DG CHEST 1V PORT
1 series · 1 of 1 positions shown · non-contrast
Comparison: None.

CLINICAL DATA: Acute respiratory failure with hypoxia. Ankle
fracture -dislocation.

EXAM:
PORTABLE CHEST 1 VIEW

[AP]
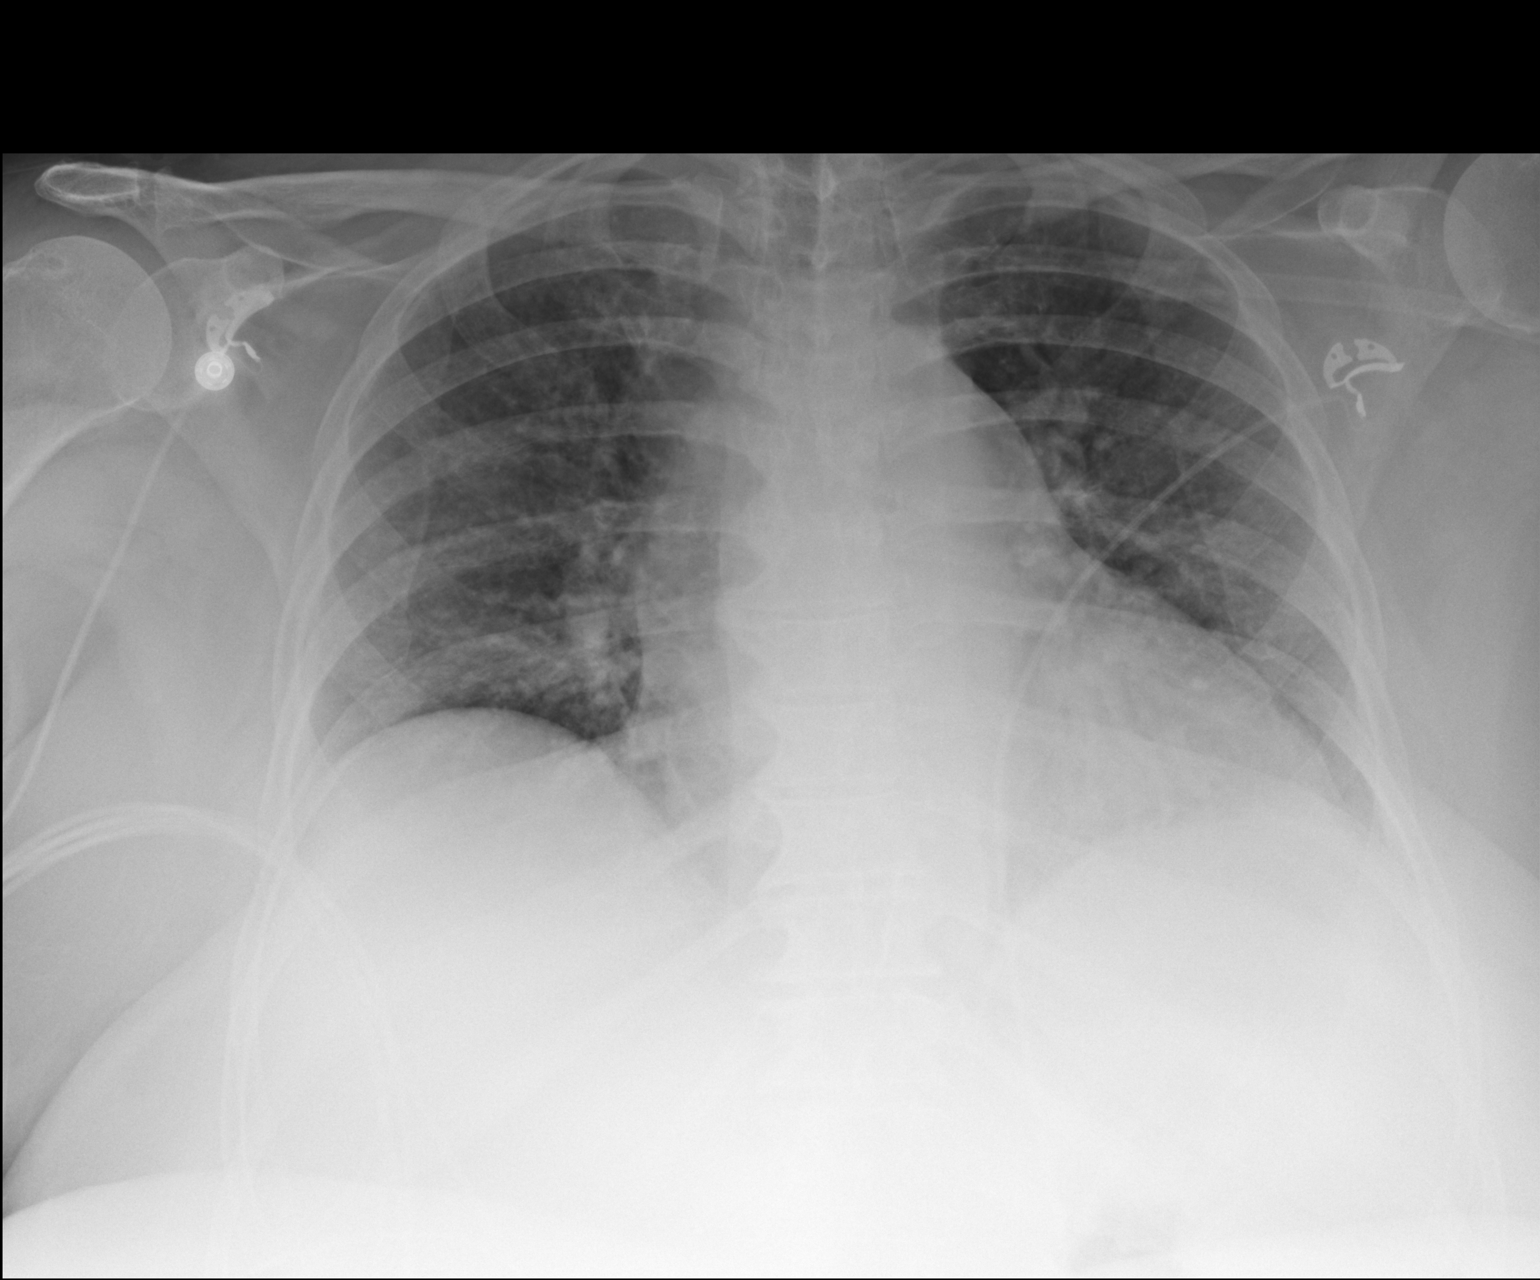

[1 of 1 positions shown; findings below may reference images not displayed]

FINDINGS: Low lung volumes are seen however both lungs are clear. No evidence
of pneumothorax or pleural effusion. No evidence of pulmonary
consolidation or edema. Heart size is within normal limits.
IMPRESSION: Low lung volumes.  No active disease.

## 2017-07-08 NOTE — Care Coordination-Inpatient (Signed)
48 HOUR CALL    1. [facility], if not enter reason. No, patient states that she hasn't slept and doesn't want any company, and refused a different day.      2. Does a referral need made to [facility] case manager for additional follow up? No, patient refuses additional follow up.     3. PCP/Specialist follow up: patient states that she has her f/u appts made.     4. Where prescriptions filled? Yes      If not, whats the plan to obtain medications?     5. Are there any questions about medications? No      Comment:     6. Assess how patient is able to take care of self compared to when they were discharged from the hospital. Patient states that she is doing well but is still tired. She states that she didn't sleep well at all last night and is just tired. Education provided as much a possible on s/s CHF exacerbation and when to call MD and when to call 911. Patient states that there was a discrepancy with her medication but she already called the physician and got clarification. She also states that she has help from her daughters as needed. Patient denies any further questions or concerns at this time.

## 2017-08-10 ENCOUNTER — Encounter

## 2017-09-07 ENCOUNTER — Encounter: Primary: Medical

## 2017-09-07 ENCOUNTER — Encounter: Payer: Medicare (Managed Care) | Primary: Medical

## 2018-03-18 ENCOUNTER — Inpatient Hospital Stay: Admit: 2018-03-18 | Attending: Medical | Primary: Medical

## 2018-03-18 NOTE — Other (Unsigned)
Patient Acct Nbr: [id number]   Primary AUTH/CERT:   Primary Insurance Company Name: [employer]  Primary Insurance Plan name: [employer]  Primary Insurance Group Number: [id number]  Primary Insurance Plan Type: Health  Primary Insurance Policy Number: [id number]    Secondary AUTH/CERT:   Secondary Insurance Company Name: [facility]  Secondary Insurance Plan [facility]: [facility]  Secondary Insurance Group Number: [id number]  Secondary Insurance Plan Type: Health  Secondary Insurance Policy Number: [id number]

## 2018-05-09 ENCOUNTER — Inpatient Hospital Stay: Admit: 2018-05-09 | Attending: Medical | Primary: Medical

## 2018-05-09 ENCOUNTER — Ambulatory Visit: Primary: Medical

## 2018-05-09 NOTE — Other (Unsigned)
Patient Acct Nbr: [id number]   Primary AUTH/CERT:   Primary Insurance Company Name: [employer]  Primary Insurance Plan name: [employer]  Primary Insurance Group Number: [id number]  Primary Insurance Plan Type: Health  Primary Insurance Policy Number: [id number]

## 2019-04-27 ENCOUNTER — Inpatient Hospital Stay: Admit: 2019-04-27 | Attending: Family | Primary: Medical

## 2019-04-27 NOTE — Other (Unsigned)
Patient Acct Nbr: [id number]   Primary AUTH/CERT: [id number]  Primary Insurance Company Name: [facility]  Primary Insurance Plan name: [facility] Secure  Primary Insurance Group Number: [id number]  Primary Insurance Plan Type: Health  Primary Insurance Policy Number: [id number]

## 2019-08-17 ENCOUNTER — Ambulatory Visit: Admit: 2019-08-17 | Payer: Medicare (Managed Care) | Primary: Medical

## 2019-09-14 ENCOUNTER — Ambulatory Visit: Admit: 2019-09-14 | Payer: Medicare (Managed Care) | Primary: Medical

## 2019-11-01 ENCOUNTER — Inpatient Hospital Stay
Admit: 2019-11-01 | Discharge: 2019-11-01 | Disposition: A | Discharge status: Home / Self Care | Attending: Emergency Medicine

## 2019-11-01 DIAGNOSIS — H532 Diplopia: Secondary | ICD-10-CM

## 2019-11-01 LAB — SEDIMENTATION RATE: Sed Rate, Automated: 30 mm/h — ABNORMAL HIGH (ref 0–20)

## 2019-11-01 LAB — CBC
Hematocrit: 44.4 % (ref 35.0–47.0)
Hemoglobin: 14.4 g/dL (ref 11.7–16.0)
MCH: 28.2 pg (ref 26.0–34.0)
MCHC: 32.4 % (ref 32.0–36.0)
MCV: 86.9 fL (ref 79.0–98.0)
MPV: 8.3 fL (ref 7.4–10.4)
Platelets: 213 10*3/uL (ref 140–440)
RBC: 5.11 10*6/uL (ref 3.80–5.20)
RDW: 14.6 % — ABNORMAL HIGH (ref 11.5–14.5)
WBC: 6.7 10*3/uL (ref 3.6–10.7)

## 2019-11-01 LAB — C-REACTIVE PROTEIN: CRP: 10.2 mg/L — ABNORMAL HIGH (ref 0.0–6.0)

## 2019-11-01 LAB — BASIC METABOLIC PANEL
Anion Gap: 10 mmol/L (ref 3–13)
BUN: 27 mg/dL — ABNORMAL HIGH (ref 7–20)
CO2: 23 mmol/L (ref 22–30)
Calcium: 9.3 mg/dL (ref 8.4–10.4)
Chloride: 107 mmol/L (ref 98–107)
Creatinine: 0.89 mg/dL (ref 0.52–1.25)
Glucose: 175 mg/dL — ABNORMAL HIGH (ref 70–100)
Potassium: 3.9 mmol/L (ref 3.5–5.1)
Sodium: 139 mmol/L (ref 135–145)
eGFR AA: 73.5 mL/min (ref >60)
eGFR NON-AA: 63.4 mL/min (ref >60)

## 2019-11-01 LAB — PROTIME-INR
INR: 1.2 NA — ABNORMAL HIGH (ref 0.9–1.1)
Protime: 12.9 s — ABNORMAL HIGH (ref 9.0–12.0)

## 2019-11-01 MED ORDER — PREDNISONE 20 MG PO TABS
20 MG | ORAL_TABLET | Freq: Every day | ORAL | 0 refills | Status: AC
Start: 2019-11-01 — End: 2019-12-01

## 2019-11-01 MED ORDER — PREDNISONE 20 MG PO TABS
20 MG | Freq: Once | ORAL | Status: AC
Start: 2019-11-01 — End: 2019-11-01
  Administered 2019-11-01: 17:00:00 60 mg via ORAL

## 2019-11-01 MED FILL — PREDNISONE 20 MG PO TABS: 20 MG | ORAL | Qty: 3

## 2019-11-01 NOTE — Discharge Instructions (Addendum)
Please call the number provided for Dr. Barbara Cower (Rheumatology) office to schedule an appointment as soon as possible. Continue taking prescribed medications for the next 30 days unless instructed otherwise by the Rheumatologist. Please keep your scheduled appointments with your primary doctor and optometrist. Return to the ED if you have any visual disturbance/loss, worsening pain in scalp or jaw, weakness or numbness or any other reason you would return.

## 2019-11-02 NOTE — Telephone Encounter (Signed)
Advised will need referral from PCP and current labs for ANA, Sed Rate, Rheum Factor, CRP, Uric Acid prior to scheduling as no referral in chart//gla

## 2019-11-02 NOTE — Telephone Encounter (Signed)
Name of Caller: Tracy Atkinson     Contact phone number: [phone]    Relationship to Patient: patient    Provider: Dr Marlow Baars     Practice:  Rheumatology     Chief Complaint/Reason for Call: [name] called in requesting a call back to schedule an appointment for her arthritis. Please advise.     Best time of day caller can be reached: any       Patient advised that office/PCP has 24-48 business hours to return their call: No
